# Patient Record
Sex: Female | Born: 1935 | Race: White | Hispanic: No | Marital: Married | Smoking: Never smoker
Health system: Southern US, Community
[De-identification: ages and names within clinical notes are randomized; demographics above are authoritative.]

## PROBLEM LIST (undated history)

## (undated) DIAGNOSIS — K2940 Chronic atrophic gastritis without bleeding: Secondary | ICD-10-CM

## (undated) DIAGNOSIS — L80 Vitiligo: Secondary | ICD-10-CM

## (undated) DIAGNOSIS — M069 Rheumatoid arthritis, unspecified: Secondary | ICD-10-CM

## (undated) DIAGNOSIS — Z862 Personal history of diseases of the blood and blood-forming organs and certain disorders involving the immune mechanism: Secondary | ICD-10-CM

## (undated) DIAGNOSIS — K3183 Achlorhydria: Secondary | ICD-10-CM

## (undated) DIAGNOSIS — S065XAA Traumatic subdural hemorrhage with loss of consciousness status unknown, initial encounter: Secondary | ICD-10-CM

## (undated) DIAGNOSIS — S065X9A Traumatic subdural hemorrhage with loss of consciousness of unspecified duration, initial encounter: Secondary | ICD-10-CM

## (undated) DIAGNOSIS — E079 Disorder of thyroid, unspecified: Secondary | ICD-10-CM

## (undated) HISTORY — DX: Rheumatoid arthritis, unspecified: M06.9

## (undated) HISTORY — DX: Personal history of diseases of the blood and blood-forming organs and certain disorders involving the immune mechanism: Z86.2

## (undated) HISTORY — DX: Traumatic subdural hemorrhage with loss of consciousness status unknown, initial encounter: S06.5XAA

## (undated) HISTORY — PX: APPENDECTOMY: SHX54

## (undated) HISTORY — PX: SPLENECTOMY: SUR1306

## (undated) HISTORY — DX: Chronic atrophic gastritis without bleeding: K29.40

## (undated) HISTORY — DX: Traumatic subdural hemorrhage with loss of consciousness of unspecified duration, initial encounter: S06.5X9A

## (undated) HISTORY — DX: Disorder of thyroid, unspecified: E07.9

## (undated) HISTORY — DX: Vitiligo: L80

## (undated) HISTORY — DX: Achlorhydria: K31.83

## (undated) HISTORY — PX: CATARACT EXTRACTION: SUR2

---

## 2000-11-12 ENCOUNTER — Other Ambulatory Visit: Admission: RE | Admit: 2000-11-12 | Discharge: 2000-11-12 | Payer: Self-pay | Admitting: Gastroenterology

## 2000-11-12 ENCOUNTER — Encounter (INDEPENDENT_AMBULATORY_CARE_PROVIDER_SITE_OTHER): Payer: Self-pay

## 2002-03-16 ENCOUNTER — Inpatient Hospital Stay (HOSPITAL_COMMUNITY): Admission: EM | Admit: 2002-03-16 | Discharge: 2002-03-21 | Payer: Self-pay | Admitting: *Deleted

## 2002-03-17 ENCOUNTER — Encounter (INDEPENDENT_AMBULATORY_CARE_PROVIDER_SITE_OTHER): Payer: Self-pay | Admitting: Cardiology

## 2002-03-19 ENCOUNTER — Encounter: Payer: Self-pay | Admitting: Neurosurgery

## 2002-03-23 ENCOUNTER — Emergency Department (HOSPITAL_COMMUNITY): Admission: EM | Admit: 2002-03-23 | Discharge: 2002-03-23 | Payer: Self-pay | Admitting: Emergency Medicine

## 2002-03-23 ENCOUNTER — Encounter: Payer: Self-pay | Admitting: Emergency Medicine

## 2002-03-31 ENCOUNTER — Encounter: Payer: Self-pay | Admitting: Neurosurgery

## 2002-03-31 ENCOUNTER — Ambulatory Visit (HOSPITAL_COMMUNITY): Admission: RE | Admit: 2002-03-31 | Discharge: 2002-03-31 | Payer: Self-pay | Admitting: Neurosurgery

## 2002-04-10 ENCOUNTER — Ambulatory Visit (HOSPITAL_COMMUNITY): Admission: RE | Admit: 2002-04-10 | Discharge: 2002-04-10 | Payer: Self-pay | Admitting: Neurology

## 2002-04-10 ENCOUNTER — Encounter: Payer: Self-pay | Admitting: Neurology

## 2002-04-18 ENCOUNTER — Ambulatory Visit (HOSPITAL_COMMUNITY): Admission: RE | Admit: 2002-04-18 | Discharge: 2002-04-18 | Payer: Self-pay | Admitting: Neurology

## 2002-04-18 ENCOUNTER — Encounter: Payer: Self-pay | Admitting: Neurology

## 2002-05-11 ENCOUNTER — Encounter: Payer: Self-pay | Admitting: Pediatrics

## 2002-05-11 ENCOUNTER — Inpatient Hospital Stay (HOSPITAL_COMMUNITY): Admission: EM | Admit: 2002-05-11 | Discharge: 2002-05-21 | Payer: Self-pay

## 2002-05-13 ENCOUNTER — Encounter: Payer: Self-pay | Admitting: Neurosurgery

## 2002-05-16 ENCOUNTER — Encounter: Payer: Self-pay | Admitting: Neurosurgery

## 2002-05-17 ENCOUNTER — Encounter: Payer: Self-pay | Admitting: Neurosurgery

## 2002-05-19 ENCOUNTER — Encounter: Payer: Self-pay | Admitting: Neurosurgery

## 2002-05-20 ENCOUNTER — Encounter: Payer: Self-pay | Admitting: Neurosurgery

## 2002-05-21 ENCOUNTER — Encounter: Payer: Self-pay | Admitting: Neurosurgery

## 2002-06-08 ENCOUNTER — Emergency Department (HOSPITAL_COMMUNITY): Admission: EM | Admit: 2002-06-08 | Discharge: 2002-06-09 | Payer: Self-pay | Admitting: Emergency Medicine

## 2002-06-09 ENCOUNTER — Encounter: Payer: Self-pay | Admitting: Emergency Medicine

## 2002-09-25 ENCOUNTER — Encounter: Payer: Self-pay | Admitting: Neurosurgery

## 2002-09-25 ENCOUNTER — Encounter: Admission: RE | Admit: 2002-09-25 | Discharge: 2002-09-25 | Payer: Self-pay | Admitting: Neurosurgery

## 2002-09-27 ENCOUNTER — Ambulatory Visit (HOSPITAL_COMMUNITY): Admission: RE | Admit: 2002-09-27 | Discharge: 2002-09-27 | Payer: Self-pay | Admitting: Neurosurgery

## 2002-09-27 ENCOUNTER — Encounter: Payer: Self-pay | Admitting: Neurosurgery

## 2003-05-29 ENCOUNTER — Other Ambulatory Visit: Admission: RE | Admit: 2003-05-29 | Discharge: 2003-05-29 | Payer: Self-pay | Admitting: Neurosurgery

## 2003-08-18 ENCOUNTER — Encounter: Admission: RE | Admit: 2003-08-18 | Discharge: 2003-08-18 | Payer: Self-pay | Admitting: Neurosurgery

## 2003-08-28 ENCOUNTER — Encounter: Admission: RE | Admit: 2003-08-28 | Discharge: 2003-08-28 | Payer: Self-pay | Admitting: Neurosurgery

## 2003-10-14 ENCOUNTER — Encounter: Admission: RE | Admit: 2003-10-14 | Discharge: 2003-10-14 | Payer: Self-pay | Admitting: Neurosurgery

## 2004-05-10 ENCOUNTER — Ambulatory Visit: Payer: Self-pay | Admitting: Internal Medicine

## 2004-05-25 ENCOUNTER — Ambulatory Visit: Payer: Self-pay | Admitting: Internal Medicine

## 2004-09-22 ENCOUNTER — Ambulatory Visit: Payer: Self-pay | Admitting: Internal Medicine

## 2005-05-12 ENCOUNTER — Ambulatory Visit: Payer: Self-pay | Admitting: Internal Medicine

## 2005-07-13 ENCOUNTER — Ambulatory Visit: Payer: Self-pay | Admitting: Internal Medicine

## 2005-11-07 ENCOUNTER — Ambulatory Visit: Payer: Self-pay | Admitting: Internal Medicine

## 2006-05-10 ENCOUNTER — Ambulatory Visit: Payer: Self-pay | Admitting: Internal Medicine

## 2007-02-01 ENCOUNTER — Telehealth: Payer: Self-pay | Admitting: *Deleted

## 2007-04-30 ENCOUNTER — Telehealth: Payer: Self-pay | Admitting: Internal Medicine

## 2007-05-02 ENCOUNTER — Ambulatory Visit: Payer: Self-pay | Admitting: Internal Medicine

## 2007-08-23 ENCOUNTER — Telehealth: Payer: Self-pay | Admitting: Internal Medicine

## 2007-08-27 ENCOUNTER — Telehealth (INDEPENDENT_AMBULATORY_CARE_PROVIDER_SITE_OTHER): Payer: Self-pay | Admitting: *Deleted

## 2007-09-11 DIAGNOSIS — E039 Hypothyroidism, unspecified: Secondary | ICD-10-CM | POA: Insufficient documentation

## 2007-09-11 DIAGNOSIS — D510 Vitamin B12 deficiency anemia due to intrinsic factor deficiency: Secondary | ICD-10-CM | POA: Insufficient documentation

## 2007-09-11 DIAGNOSIS — Z9089 Acquired absence of other organs: Secondary | ICD-10-CM | POA: Insufficient documentation

## 2007-09-11 DIAGNOSIS — K3183 Achlorhydria: Secondary | ICD-10-CM | POA: Insufficient documentation

## 2007-09-12 ENCOUNTER — Ambulatory Visit: Payer: Self-pay | Admitting: Internal Medicine

## 2007-09-12 DIAGNOSIS — L80 Vitiligo: Secondary | ICD-10-CM | POA: Insufficient documentation

## 2007-09-12 LAB — CONVERTED CEMR LAB
ALT: 22 units/L (ref 0–35)
AST: 36 units/L (ref 0–37)
Albumin: 3.7 g/dL (ref 3.5–5.2)
Basophils Absolute: 0.1 10*3/uL (ref 0.0–0.1)
Calcium: 9.9 mg/dL (ref 8.4–10.5)
Chloride: 100 meq/L (ref 96–112)
Creatinine, Ser: 0.6 mg/dL (ref 0.4–1.2)
Eosinophils Absolute: 0.2 10*3/uL (ref 0.0–0.6)
Eosinophils Relative: 2.3 % (ref 0.0–5.0)
GFR calc non Af Amer: 105 mL/min
Glucose, Bld: 94 mg/dL (ref 70–99)
HCT: 39.2 % (ref 36.0–46.0)
LDL Cholesterol: 104 mg/dL — ABNORMAL HIGH (ref 0–99)
MCV: 94.4 fL (ref 78.0–100.0)
Neutrophils Relative %: 55.8 % (ref 43.0–77.0)
Platelets: 551 10*3/uL — ABNORMAL HIGH (ref 150–400)
RBC: 4.16 M/uL (ref 3.87–5.11)
RDW: 13 % (ref 11.5–14.6)
Sodium: 135 meq/L (ref 135–145)
Total Bilirubin: 0.5 mg/dL (ref 0.3–1.2)
Total CHOL/HDL Ratio: 3
Triglycerides: 77 mg/dL (ref 0–149)
WBC: 9.9 10*3/uL (ref 4.5–10.5)

## 2007-09-17 ENCOUNTER — Telehealth (INDEPENDENT_AMBULATORY_CARE_PROVIDER_SITE_OTHER): Payer: Self-pay | Admitting: *Deleted

## 2008-02-11 ENCOUNTER — Telehealth: Payer: Self-pay | Admitting: Internal Medicine

## 2008-02-18 ENCOUNTER — Ambulatory Visit: Payer: Self-pay | Admitting: Internal Medicine

## 2008-02-18 LAB — CONVERTED CEMR LAB: Vit D, 1,25-Dihydroxy: 88 (ref 30–89)

## 2008-02-19 LAB — CONVERTED CEMR LAB: TSH: 0.09 microintl units/mL — ABNORMAL LOW (ref 0.35–5.50)

## 2008-04-13 ENCOUNTER — Ambulatory Visit: Payer: Self-pay | Admitting: Internal Medicine

## 2008-07-28 ENCOUNTER — Ambulatory Visit: Payer: Self-pay | Admitting: Internal Medicine

## 2008-07-28 DIAGNOSIS — M1710 Unilateral primary osteoarthritis, unspecified knee: Secondary | ICD-10-CM | POA: Insufficient documentation

## 2008-10-05 ENCOUNTER — Ambulatory Visit: Payer: Self-pay | Admitting: Internal Medicine

## 2008-10-05 DIAGNOSIS — M7020 Olecranon bursitis, unspecified elbow: Secondary | ICD-10-CM | POA: Insufficient documentation

## 2008-11-18 ENCOUNTER — Telehealth: Payer: Self-pay | Admitting: Internal Medicine

## 2008-11-23 ENCOUNTER — Telehealth (INDEPENDENT_AMBULATORY_CARE_PROVIDER_SITE_OTHER): Payer: Self-pay | Admitting: *Deleted

## 2008-11-25 ENCOUNTER — Telehealth: Payer: Self-pay | Admitting: Internal Medicine

## 2008-11-26 ENCOUNTER — Ambulatory Visit: Payer: Self-pay | Admitting: Internal Medicine

## 2008-11-26 DIAGNOSIS — K2940 Chronic atrophic gastritis without bleeding: Secondary | ICD-10-CM | POA: Insufficient documentation

## 2008-11-26 DIAGNOSIS — R112 Nausea with vomiting, unspecified: Secondary | ICD-10-CM | POA: Insufficient documentation

## 2008-11-26 DIAGNOSIS — I951 Orthostatic hypotension: Secondary | ICD-10-CM | POA: Insufficient documentation

## 2008-11-26 LAB — CONVERTED CEMR LAB
ALT: 19 units/L (ref 0–35)
AST: 24 units/L (ref 0–37)
Albumin: 3.4 g/dL — ABNORMAL LOW (ref 3.5–5.2)
Alkaline Phosphatase: 155 units/L — ABNORMAL HIGH (ref 39–117)
Basophils Absolute: 0 10*3/uL (ref 0.0–0.1)
Basophils Relative: 0.1 % (ref 0.0–3.0)
Eosinophils Relative: 1.1 % (ref 0.0–5.0)
HCT: 39.8 % (ref 36.0–46.0)
Hemoglobin: 13.3 g/dL (ref 12.0–15.0)
Lymphocytes Relative: 19 % (ref 12.0–46.0)
Lymphs Abs: 2.7 10*3/uL (ref 0.7–4.0)
Monocytes Relative: 1.5 % — ABNORMAL LOW (ref 3.0–12.0)
Neutro Abs: 10.9 10*3/uL — ABNORMAL HIGH (ref 1.4–7.7)
RBC: 4.21 M/uL (ref 3.87–5.11)
Total Protein: 8.1 g/dL (ref 6.0–8.3)

## 2009-01-01 ENCOUNTER — Telehealth: Payer: Self-pay | Admitting: Internal Medicine

## 2009-03-31 ENCOUNTER — Telehealth: Payer: Self-pay | Admitting: Internal Medicine

## 2009-04-09 ENCOUNTER — Ambulatory Visit: Payer: Self-pay | Admitting: Internal Medicine

## 2009-04-09 LAB — CONVERTED CEMR LAB
TSH: 0.25 microintl units/mL — ABNORMAL LOW (ref 0.35–5.50)
Vit D, 25-Hydroxy: 74 ng/mL (ref 30–89)

## 2009-04-20 ENCOUNTER — Telehealth: Payer: Self-pay | Admitting: Internal Medicine

## 2009-04-22 ENCOUNTER — Telehealth: Payer: Self-pay | Admitting: Internal Medicine

## 2009-08-13 IMAGING — CR DG ELBOW COMPLETE 3+V*L*
4 series · 4 of 4 positions shown · non-contrast
Comparison: None

CLINICAL DATA: Osteoarthritis

LEFT ELBOW - COMPLETE 3+ VIEW

[view not recorded (1 of 4)]
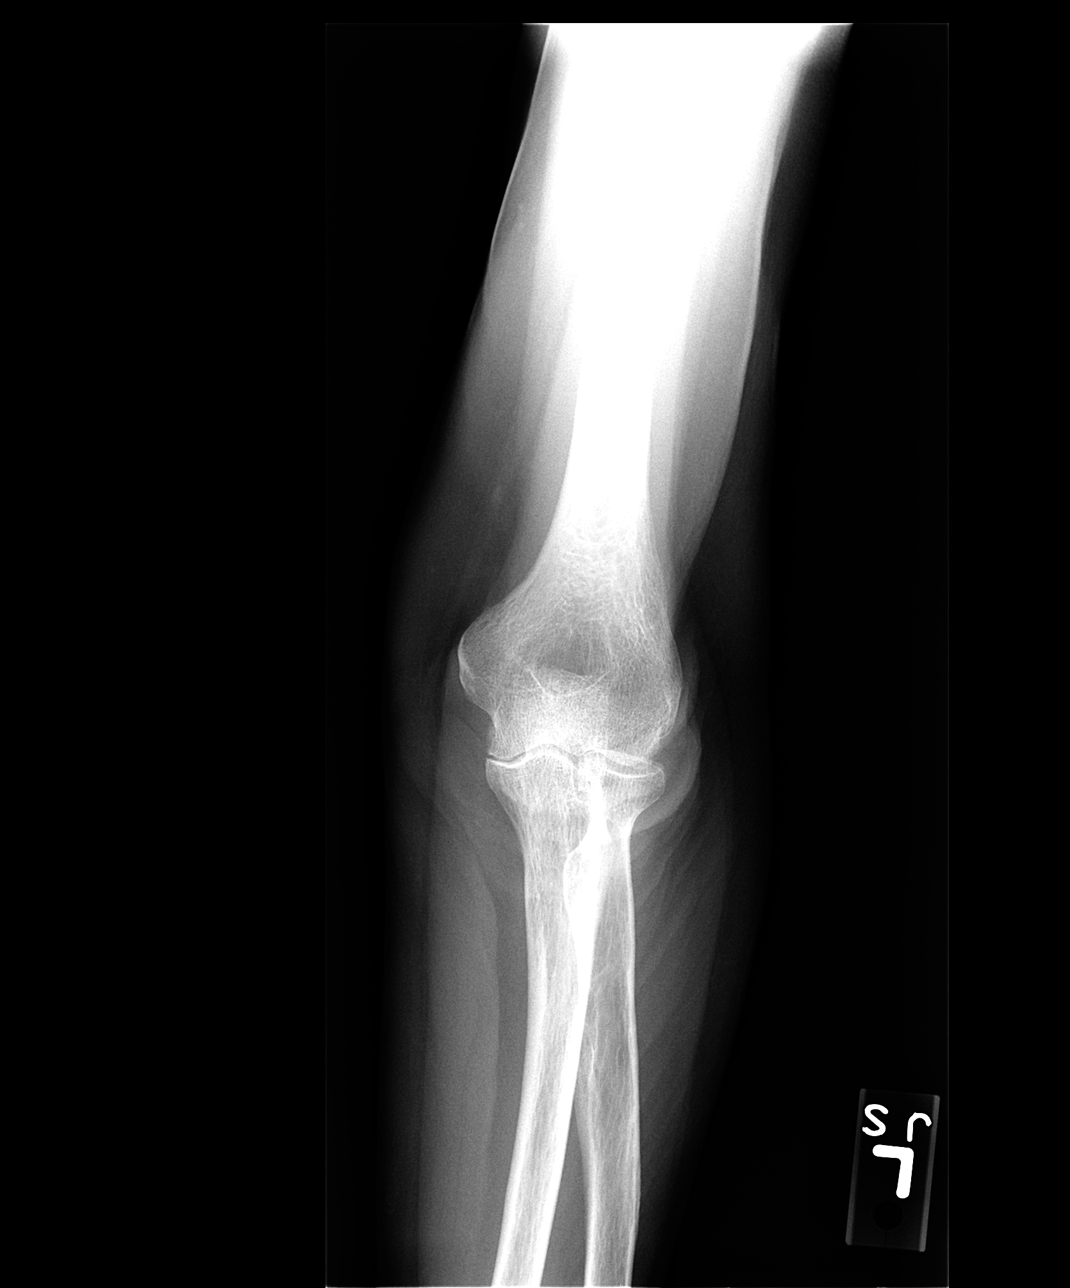

[view not recorded (2 of 4)]
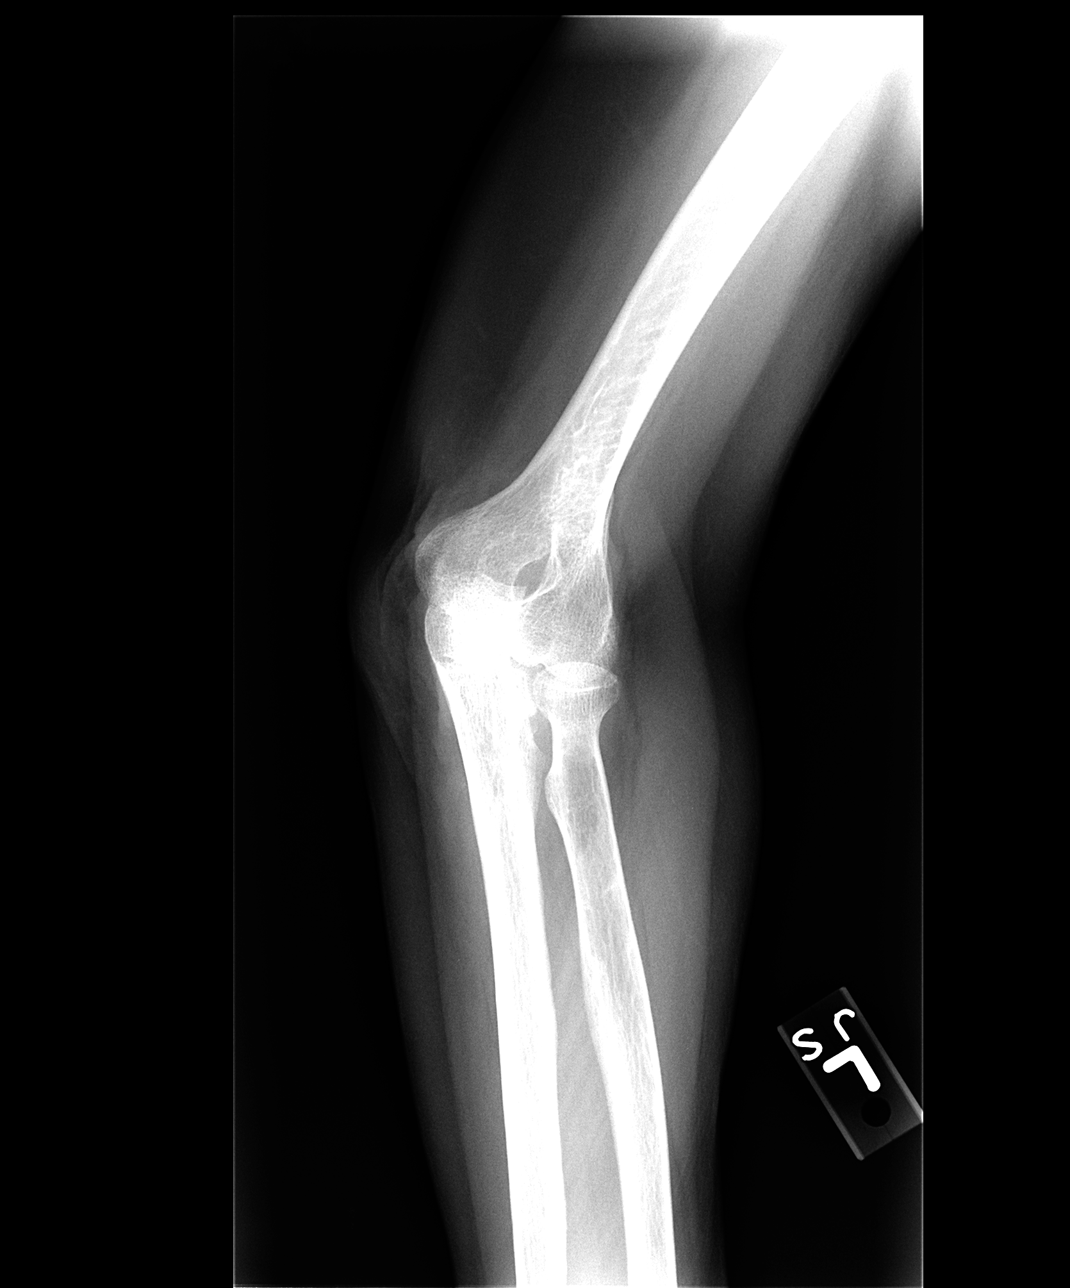

[view not recorded (3 of 4)]
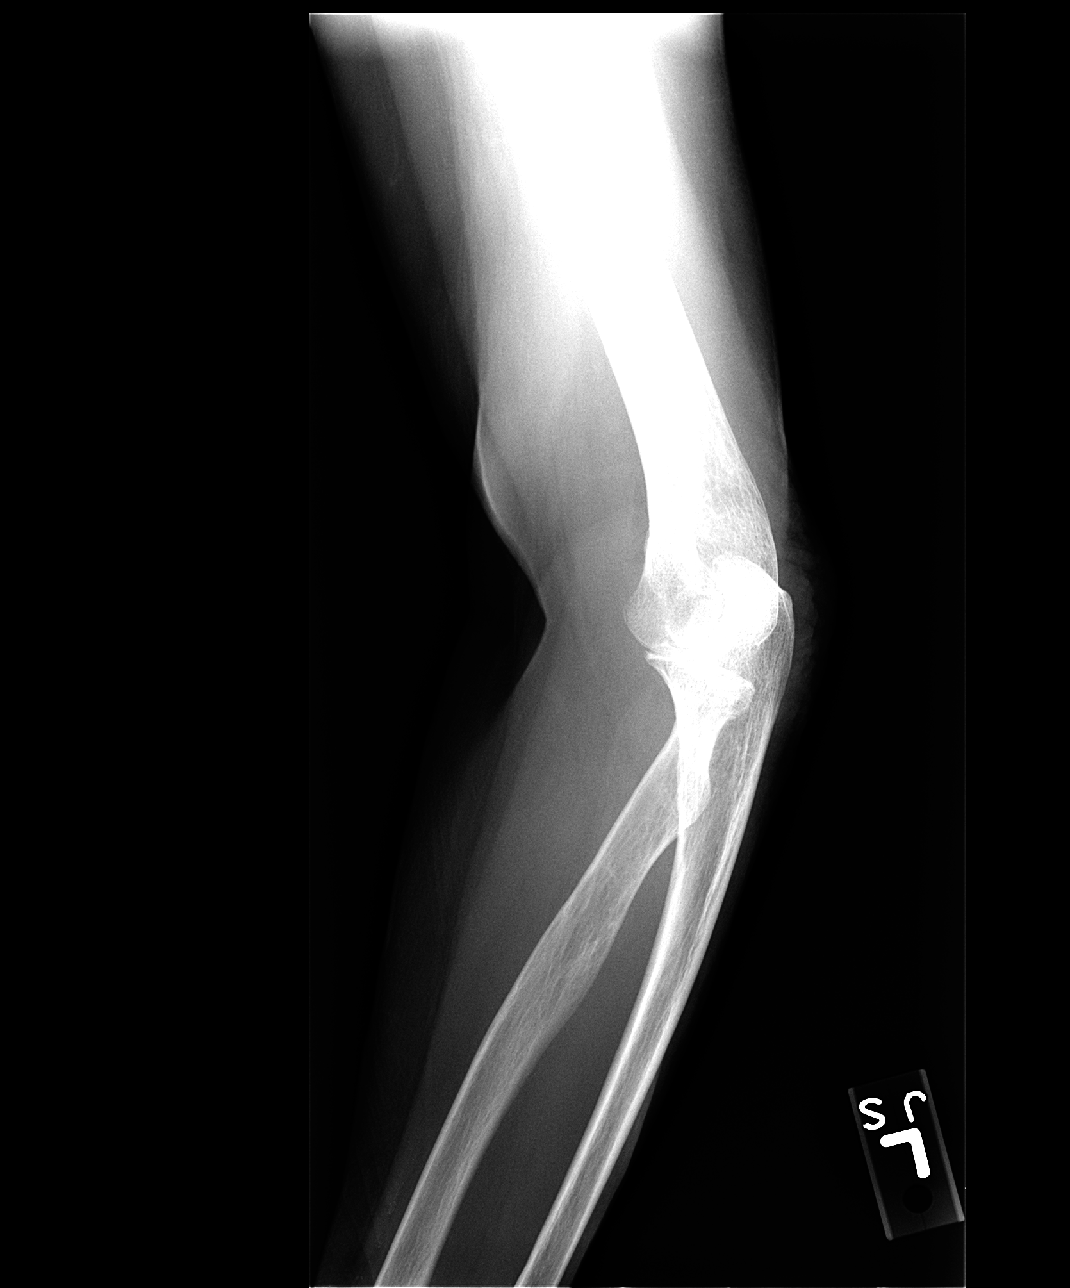

[view not recorded (4 of 4)]
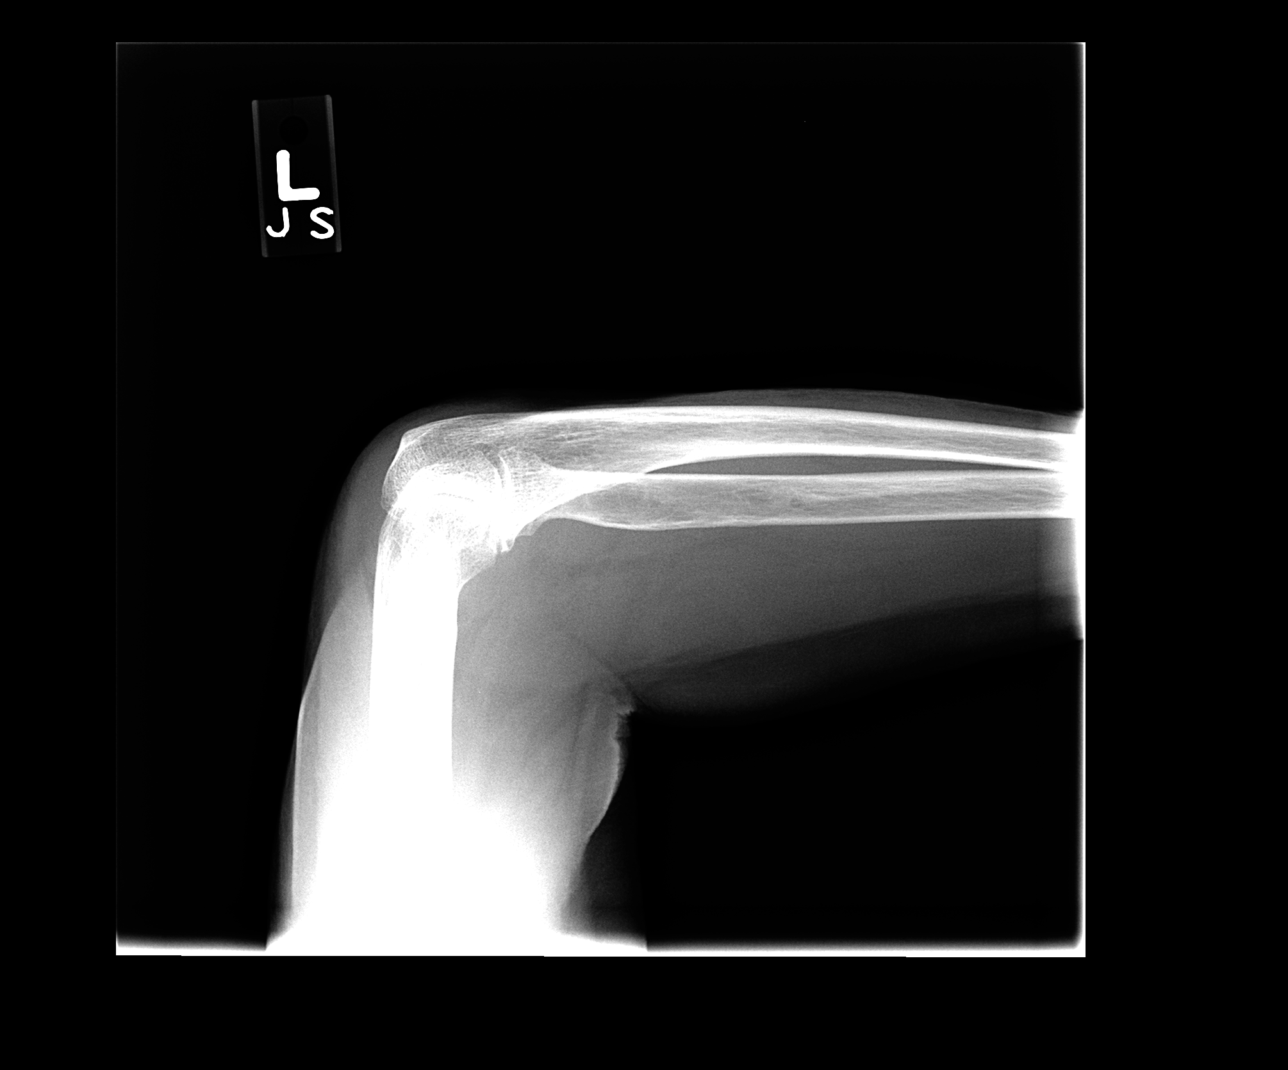

[4 of 4 positions shown; findings below may reference images not displayed]

FINDINGS: There is elevation of the anterior and posterior fat pad
suggesting joint effusion.

There are no specific features to suggest osteoarthritis.

Specifically, the joint spaces are well preserved.  There is no
significant subchondral sclerosis or spur formation.  No discrete
erosions are identified.

No radiopaque foreign bodies or soft tissue calcifications noted.
IMPRESSION: 1.  Suspected joint effusion.  Infectious or inflammatory arthritis
is not excluded.

## 2009-08-30 ENCOUNTER — Ambulatory Visit: Payer: Self-pay | Admitting: Internal Medicine

## 2009-08-30 ENCOUNTER — Telehealth: Payer: Self-pay | Admitting: Internal Medicine

## 2010-02-03 ENCOUNTER — Ambulatory Visit: Payer: Self-pay | Admitting: Internal Medicine

## 2010-02-03 DIAGNOSIS — M1990 Unspecified osteoarthritis, unspecified site: Secondary | ICD-10-CM | POA: Insufficient documentation

## 2010-02-03 LAB — CONVERTED CEMR LAB
BUN: 11 mg/dL (ref 6–23)
Basophils Relative: 0.4 % (ref 0.0–3.0)
CO2: 29 meq/L (ref 19–32)
Chloride: 98 meq/L (ref 96–112)
Eosinophils Absolute: 0.2 10*3/uL (ref 0.0–0.7)
Eosinophils Relative: 1.8 % (ref 0.0–5.0)
Free T4: 1.28 ng/dL (ref 0.60–1.60)
HCT: 36.9 % (ref 36.0–46.0)
Lymphs Abs: 3.2 10*3/uL (ref 0.7–4.0)
MCHC: 33.4 g/dL (ref 30.0–36.0)
MCV: 95.9 fL (ref 78.0–100.0)
Monocytes Absolute: 0.8 10*3/uL (ref 0.1–1.0)
Platelets: 503 10*3/uL — ABNORMAL HIGH (ref 150.0–400.0)
Potassium: 4.1 meq/L (ref 3.5–5.1)
Vitamin B-12: 928 pg/mL — ABNORMAL HIGH (ref 211–911)
WBC: 10.1 10*3/uL (ref 4.5–10.5)

## 2010-04-04 ENCOUNTER — Telehealth: Payer: Self-pay | Admitting: Internal Medicine

## 2010-04-06 ENCOUNTER — Ambulatory Visit: Payer: Self-pay | Admitting: Internal Medicine

## 2010-06-10 ENCOUNTER — Ambulatory Visit: Payer: Self-pay | Admitting: Internal Medicine

## 2010-06-20 ENCOUNTER — Ambulatory Visit: Payer: Self-pay | Admitting: Internal Medicine

## 2010-06-20 ENCOUNTER — Telehealth: Payer: Self-pay | Admitting: Internal Medicine

## 2010-06-20 DIAGNOSIS — L509 Urticaria, unspecified: Secondary | ICD-10-CM | POA: Insufficient documentation

## 2010-07-31 ENCOUNTER — Encounter: Payer: Self-pay | Admitting: Neurosurgery

## 2010-08-09 NOTE — Progress Notes (Signed)
Summary: change pharmacy & ?pneumonia shot  Phone Note Call from Patient Call back at Home Phone [phone]   Reason for Call: Acute Illness Summary of Call: Wants you to change pharmacy from [facility] to [facility] 2) Have appt Wed 3:10 for flu shot.  Also Rushie Goltz gives me yearly pneumonia shot.  How to get it also.   Initial call taken by: Rudy Jew, RN,  April 04, 2010 4:20 PM

## 2010-08-09 NOTE — Assessment & Plan Note (Signed)
Summary: flu shot//ccm  Nurse Visit   Allergies: No Known Drug Allergies  Immunizations Administered:  Pneumonia Vaccine:    Vaccine Type: Pneumovax    Site: right deltoid    Mfr: Merck    Dose: 0.5 ml    Route: IM    Given by: Kern Reap CMA (AAMA)    Exp. Date: 09/22/2011    Lot #: [id number]    VIS given: 06/14/09 version given April 06, 2010.    Physician counseled: yes  Influenza Vaccine # 1:    Vaccine Type: Fluvax MCR    Site: left deltoid    Mfr: GlaxoSmithKline    Dose: 0.5 ml    Route: IM    Given by: Kern Reap CMA (AAMA)    Exp. Date: 01/07/2011    Lot #: [id number]    VIS given: 02/01/10 version given April 06, 2010.    Physician counseled: yes  Orders Added: 1)  Pneumococcal Vaccine [90732] 2)  Admin 1st Vaccine [90471] 3)  Influenza Vaccine MCR [00025]

## 2010-08-09 NOTE — Assessment & Plan Note (Signed)
Summary: boil on neck/njr call was triaged by deb/njr   Vital Signs:  Patient profile:   77 year old female Height:      63 inches Weight:      136 pounds BMI:     24.18 Temp:     98.2 degrees F oral Pulse rate:   76 / minute Resp:     14 per minute BP sitting:   120 / 70  (left arm)  Vitals Entered By: Willy Eddy, LPN (February 03, 2010 11:59 AM) CC: c/o lesion on back of neck Is Patient Diabetic? No   CC:  c/o lesion on back of neck.  History of Present Illness: persistant osteoarthritis the pt has multiple joint involvement she has not present with acute hypotension is stable has been on syntroid but tried to change to the generic with bad resultes needs monitering of levels   Preventive Screening-Counseling & Management  Alcohol-Tobacco     Smoking Status: never     Passive Smoke Exposure: no  Current Problems (verified): 1)  Hypotension, Orthostatic  (ICD-458.0) 2)  Nausea With Vomiting  (ICD-787.01) 3)  Atrophic Gastritis Without Mention of Hemorrhage  (ICD-535.10) 4)  Olecranon Bursitis  (ICD-726.33) 5)  Loc Osteoarthros Not Spec Prim/sec Lower Leg  (ICD-715.36) 6)  Vitiligo  (ICD-709.01) 7)  Physical Examination  (ICD-V70.0) 8)  Anemia, Pernicious  (ICD-281.0) 9)  Achlorhydria  (ICD-536.0) 10)  Splenectomy, Hx of  (ICD-V45.79) 11)  Hypothyroidism  (ICD-244.9)  Current Medications (verified): 1)  Ativan 1 Mg Tabs (Lorazepam) .... One By Mouth Tid 2)  Synthroid 125 Mcg Tabs (Levothyroxine Sodium) .... Once Daily 3)  Cyanocobalamin 1000 Mcg/ml Inj Soln (Cyanocobalamin) .... 1/2 Ml Q Month 4)  Pred Forte 1 %  Susp (Prednisolone Acetate) .... As Directed 5)  Bd Tb Syringe 27g X 1/2" 1 Ml Misc (Tuberculin-Allergy Syringes) .... ,use As Directed  Allergies (verified): No Known Drug Allergies  Past History:  Family History: Last updated: 2007-09-18 father CAD  ( died at 21) mother multiple myeloma ( 89)  Social History: Last updated:  2007-09-18 Married Never Smoked Alcohol use-no Drug use-no Regular exercise-no  Risk Factors: Exercise: no (September 18, 2007)  Risk Factors: Smoking Status: never (02/03/2010) Passive Smoke Exposure: no (02/03/2010)  Past medical, surgical, family and social histories (including risk factors) reviewed, and no changes noted (except as noted below).  Past Medical History: Reviewed history from 09/18/2007 and no changes required. Autoimmune ITP Hypothyroidism achlorhydria chronic autoimmune gastritis vittiligo  Past Surgical History: Reviewed history from 2007/09/18 and no changes required. Appendectomy splenectomy cataract surgery subdural hematoma colon 2002  Family History: Reviewed history from 09-18-2007 and no changes required. father CAD  ( died at 75) mother multiple myeloma ( 72)  Social History: Reviewed history from 09/18/07 and no changes required. Married Never Smoked Alcohol use-no Drug use-no Regular exercise-no  Physical Exam  General:  alert and pale.  dehydrated Head:  normocephalic and atraumatic.   Eyes:  pupils equal and pupils round.   Ears:  R ear normal and L ear normal.   Nose:  External nasal examination shows no deformity or inflammation. Nasal mucosa are pink and moist without lesions or exudates. Mouth:  Oral mucosa and oropharynx without lesions or exudates.  Teeth in good repair. Neck:  No deformities, masses, or tenderness noted. Lungs:  normal respiratory effort and no wheezes.   Heart:  regular rhythm and tachycardia.   Abdomen:  bowel sounds hypoactive, distended, and epigastric tenderness.  Impression & Recommendations:  Problem # 1:  ATROPHIC GASTRITIS WITHOUT MENTION OF HEMORRHAGE (ICD-535.10) monitering by GI dr Jarold Motto Discussed use of medication, as well as lifestyle changes.   Problem # 2:  DEGENERATIVE JOINT DISEASE, ADVANCED (ICD-715.90) pennsaid trial with instruction sheet Discussed use of medications,  application of heat or cold, and exercises.   Problem # 3:  HYPOTHYROIDISM (ICD-244.9)  Her updated medication list for this problem includes:    Synthroid 125 Mcg Tabs (Levothyroxine sodium) ..... Once daily  Labs Reviewed: TSH: 0.23 (08/30/2009)    Chol: 179 (09/12/2007)   HDL: 59.3 (09/12/2007)   LDL: 104 (09/12/2007)   TG: 77 (09/12/2007)  Orders: TLB-TSH (Thyroid Stimulating Hormone) (84443-TSH) TLB-T3, Free (Triiodothyronine) (84481-T3FREE) TLB-T4 (Thyrox), Free [id number])  Problem # 4:  DEGENERATIVE JOINT DISEASE, ADVANCED (ICD-715.90)  trial of cymbalta  Discussed use of medications, application of heat or cold, and exercises.   Complete Medication List: 1)  Ativan 1 Mg Tabs (Lorazepam) .... One by mouth tid 2)  Synthroid 125 Mcg Tabs (Levothyroxine sodium) .... Once daily 3)  Cyanocobalamin 1000 Mcg/ml Inj Soln (Cyanocobalamin) .... 1/2 ml q month 4)  Pred Forte 1 % Susp (Prednisolone acetate) .... As directed 5)  Bd Tb Syringe 27g X 1/2" 1 Ml Misc (Tuberculin-allergy syringes) .... ,use as directed  Other Orders: TLB-BMP (Basic Metabolic Panel-BMET) (80048-METABOL) Venipuncture (32202) TLB-CBC Platelet - w/Differential (85025-CBCD) TLB-B12 + Folate Pnl (54270_62376-E83/TDV)  Patient Instructions: 1)  Please schedule a follow-up appointment in 3 months.

## 2010-08-09 NOTE — Progress Notes (Signed)
Summary: wants lab  Phone Note Call from Patient   Caller: Patient Call For: Stacie Glaze MD Reason for Call: Acute Illness Summary of Call: on synthroid 30 years and recently changed to generic- wants blood work to see if level is right ph [phone] Initial call taken by: Raechel Ache, RN,  August 30, 2009 9:27 AM  Follow-up for Phone Call        please call pot and schedule tsh Follow-up by: Willy Eddy, LPN,  August 30, 2009 11:34 AM  Additional Follow-up for Phone Call Additional follow up Details #1::        pt will come today. Additional Follow-up by: Warnell Forester,  August 30, 2009 12:24 PM

## 2010-08-11 NOTE — Progress Notes (Signed)
  Phone Note Call from Patient   Reason for Call: Privacy/Consent Authorization Summary of Call: pt calling c/o rash on arm  close to injection site-also on arms and elbows and back-using cortisone cream but didnt help--ov with dr Kirtland Bouchard this pm Initial call taken by: Willy Eddy, LPN,  June 20, 2010 10:55 AM  Follow-up for Phone Call        t

## 2010-08-11 NOTE — Assessment & Plan Note (Signed)
Summary: rash/bmw   Vital Signs:  Patient profile:   77 year old female Weight:      135 pounds Temp:     98.1 degrees F oral BP sitting:   110 / 70  (right arm) Cuff size:   regular CC: rash on lt elbow and back  Is Patient Diabetic? No   CC:  rash on lt elbow and back .  History of Present Illness:  77 year old patient who is seen today for follow-up.  Shortly after a local injection of Xylocaine and Depo-Medrol the patient developed a pruritic dermatitis involving the left lateral elbow area.  This has spread to involve urticarial lesions over her back and trunk.  She is quite uncomfortable with the pruritus.  Allergies (verified): No Known Drug Allergies  Physical Exam  General:  Well-developed,well-nourished,in no acute distress; alert,appropriate and cooperative throughout examination Skin:  erythema over the left lateral elbow region with scattered areas of excoriations; scattered  urticaria and urticarial plaques involving the back and trunk areas   Impression & Recommendations:  Problem # 1:  URTICARIA (ICD-708.9) possibly related to the local injection of Xylocaine; the rash did originate at this site  Problem # 2:  ATROPHIC GASTRITIS WITHOUT MENTION OF HEMORRHAGE (ICD-535.10)  Complete Medication List: 1)  Ativan 1 Mg Tabs (Lorazepam) .... One by mouth tid 2)  Synthroid 125 Mcg Tabs (Levothyroxine sodium) .... Once daily 3)  Cyanocobalamin 1000 Mcg/ml Inj Soln (Cyanocobalamin) .... 1/2 ml q month 4)  Pred Forte 1 % Susp (Prednisolone acetate) .... As directed 5)  Bd Tb Syringe 27g X 1/2" 1 Ml Misc (Tuberculin-allergy syringes) .... ,use as directed 6)  Prednisone (pak) 10 Mg Tabs (Prednisone) .... As directed 7)  Triamcinolone Acetonide 0.5 % Crea (Triamcinolone acetonide) .... Apply  twice daily ( avoid face)  Patient Instructions: 1)  prednisone dose pack as prescribed 2)  Please schedule a follow-up appointment as needed. Prescriptions: TRIAMCINOLONE  ACETONIDE 0.5 % CREA (TRIAMCINOLONE ACETONIDE) apply  twice daily ( avoid face)  #60 gm x 0   Entered and Authorized by:   Gordy Savers  MD   Signed by:   Gordy Savers  MD on 06/20/2010   Method used:   Print then Give to Patient   RxID:   [id number] PREDNISONE (PAK) 10 MG TABS (PREDNISONE) as directed  #10 mg 12day x 0   Entered and Authorized by:   Gordy Savers  MD   Signed by:   Gordy Savers  MD on 06/20/2010   Method used:   Print then Give to Patient   RxID:   [id number]    Orders Added: 1)  Est. Patient Level III [84696]

## 2010-08-11 NOTE — Assessment & Plan Note (Signed)
Summary: FU ON LEFT ELBOW/OK PER BONNIE/NJR   Vital Signs:  Patient profile:   77 year old female Height:      63 inches Weight:      134 pounds BMI:     23.82 Temp:     98.2 degrees F oral Pulse rate:   72 / minute Resp:     14 per minute BP sitting:   110 / 70  (left arm)  Vitals Entered By: Willy Eddy, LPN (June 10, 2010 10:33 AM) CC: c/o left elbow pain- requesting injection Is Patient Diabetic? No   Primary Care Provider:  Stacie Glaze MD  CC:  c/o left elbow pain- requesting injection.  History of Present Illness: presenta fro follow up of epicodylitis of left elbow recurrentpain with rotation of elbow tender of epicondyl fro about three weeks  Preventive Screening-Counseling & Management  Alcohol-Tobacco     Smoking Status: never     Passive Smoke Exposure: no  Problems Prior to Update: 1)  Urticaria  (ICD-708.9) 2)  Degenerative Joint Disease, Advanced  (ICD-715.90) 3)  Hypotension, Orthostatic  (ICD-458.0) 4)  Nausea With Vomiting  (ICD-787.01) 5)  Atrophic Gastritis Without Mention of Hemorrhage  (ICD-535.10) 6)  Olecranon Bursitis  (ICD-726.33) 7)  Loc Osteoarthros Not Spec Prim/sec Lower Leg  (ICD-715.36) 8)  Vitiligo  (ICD-709.01) 9)  Physical Examination  (ICD-V70.0) 10)  Anemia, Pernicious  (ICD-281.0) 11)  Achlorhydria  (ICD-536.0) 12)  Splenectomy, Hx of  (ICD-V45.79) 13)  Hypothyroidism  (ICD-244.9)  Current Problems (verified): 1)  Degenerative Joint Disease, Advanced  (ICD-715.90) 2)  Hypotension, Orthostatic  (ICD-458.0) 3)  Nausea With Vomiting  (ICD-787.01) 4)  Atrophic Gastritis Without Mention of Hemorrhage  (ICD-535.10) 5)  Olecranon Bursitis  (ICD-726.33) 6)  Loc Osteoarthros Not Spec Prim/sec Lower Leg  (ICD-715.36) 7)  Vitiligo  (ICD-709.01) 8)  Physical Examination  (ICD-V70.0) 9)  Anemia, Pernicious  (ICD-281.0) 10)  Achlorhydria  (ICD-536.0) 11)  Splenectomy, Hx of  (ICD-V45.79) 12)  Hypothyroidism   (ICD-244.9)  Medications Prior to Update: 1)  Ativan 1 Mg Tabs (Lorazepam) .... One By Mouth Tid 2)  Synthroid 125 Mcg Tabs (Levothyroxine Sodium) .... Once Daily 3)  Cyanocobalamin 1000 Mcg/ml Inj Soln (Cyanocobalamin) .... 1/2 Ml Q Month 4)  Pred Forte 1 %  Susp (Prednisolone Acetate) .... As Directed 5)  Bd Tb Syringe 27g X 1/2" 1 Ml Misc (Tuberculin-Allergy Syringes) .... ,use As Directed  Current Medications (verified): 1)  Ativan 1 Mg Tabs (Lorazepam) .... One By Mouth Tid 2)  Synthroid 125 Mcg Tabs (Levothyroxine Sodium) .... Once Daily 3)  Cyanocobalamin 1000 Mcg/ml Inj Soln (Cyanocobalamin) .... 1/2 Ml Q Month 4)  Pred Forte 1 %  Susp (Prednisolone Acetate) .... As Directed 5)  Bd Tb Syringe 27g X 1/2" 1 Ml Misc (Tuberculin-Allergy Syringes) .... ,use As Directed  Allergies (verified): No Known Drug Allergies  Past History:  Family History: Last updated: 22-Sep-2007 father CAD  ( died at 75) mother multiple myeloma ( 51)  Social History: Last updated: 09-22-07 Married Never Smoked Alcohol use-no Drug use-no Regular exercise-no  Risk Factors: Exercise: no (2007/09/22)  Risk Factors: Smoking Status: never (06/10/2010) Passive Smoke Exposure: no (06/10/2010)  Past medical, surgical, family and social histories (including risk factors) reviewed, and no changes noted (except as noted below).  Past Medical History: Reviewed history from 09/22/07 and no changes required. Autoimmune ITP Hypothyroidism achlorhydria chronic autoimmune gastritis vittiligo  Past Surgical History: Reviewed history from 09-22-07 and no changes  required. Appendectomy splenectomy cataract surgery subdural hematoma colon 2002  Family History: Reviewed history from 09/12/2007 and no changes required. father CAD  ( died at 48) mother multiple myeloma ( 27)  Social History: Reviewed history from 09/12/2007 and no changes required. Married Never Smoked Alcohol  use-no Drug use-no Regular exercise-no  Review of Systems  The patient denies anorexia, fever, weight loss, weight gain, vision loss, decreased hearing, hoarseness, chest pain, syncope, dyspnea on exertion, peripheral edema, prolonged cough, headaches, hemoptysis, abdominal pain, melena, hematochezia, severe indigestion/heartburn, hematuria, incontinence, genital sores, muscle weakness, suspicious skin lesions, transient blindness, difficulty walking, depression, unusual weight change, abnormal bleeding, enlarged lymph nodes, angioedema, and breast masses.    Physical Exam  General:  alert and pale.  dehydrated Head:  normocephalic and atraumatic.   Eyes:  pupils equal and pupils round.   Ears:  R ear normal and L ear normal.   Nose:  External nasal examination shows no deformity or inflammation. Nasal mucosa are pink and moist without lesions or exudates. Mouth:  Oral mucosa and oropharynx without lesions or exudates.  Teeth in good repair. Neck:  No deformities, masses, or tenderness noted. Lungs:  normal respiratory effort and no wheezes.   Heart:  regular rhythm and tachycardia.     Shoulder/Elbow Exam  General:    Well-developed, well-nourished, normal body habitus; no deformities, normal grooming.    Skin:    Intact, no scars, lesions, rashes, cafe au lait spots or bruising.    Inspection:    Inspection is normal.    Elbow Exam:    Left:    Inspection:  Abnormal    Tenderness:  left medial epicondyle    Swelling:  right medial epicondyle   Impression & Recommendations:  Problem # 1:  DEGENERATIVE JOINT DISEASE, ADVANCED (ICD-715.90) Assessment Deteriorated  Orders: No Charge Patient Arrived (NCPA0) (NCPA0)  Problem # 2:  OLECRANON BURSITIS (ICD-726.33) Assessment: Deteriorated  Informed consent obtained and then theleft elbow  joint was prepped in a sterile manor and 40 mg depo and 1/2 cc 1% lidocaine injected into the synovial space. After care discussed. Pt  tolerated procedure well.  Orders: No Charge Patient Arrived (NCPA0) (NCPA0) Joint Aspirate / Injection, Large (20610) Depo- Medrol 40mg  (J1030)  Complete Medication List: 1)  Ativan 1 Mg Tabs (Lorazepam) .... One by mouth tid 2)  Synthroid 125 Mcg Tabs (Levothyroxine sodium) .... Once daily 3)  Cyanocobalamin 1000 Mcg/ml Inj Soln (Cyanocobalamin) .... 1/2 ml q month 4)  Pred Forte 1 % Susp (Prednisolone acetate) .... As directed 5)  Bd Tb Syringe 27g X 1/2" 1 Ml Misc (Tuberculin-allergy syringes) .... ,use as directed 6)  Prednisone (pak) 10 Mg Tabs (Prednisone) .... As directed 7)  Triamcinolone Acetonide 0.5 % Crea (Triamcinolone acetonide) .... Apply  twice daily ( avoid face)  Patient Instructions: 1)  Please schedule a follow-up appointment in 6 months.   Orders Added: 1)  No Charge Patient Arrived (NCPA0) [NCPA0] 2)  Joint Aspirate / Injection, Large [20610] 3)  Depo- Medrol 40mg  [J1030]

## 2010-10-17 ENCOUNTER — Other Ambulatory Visit: Payer: Self-pay | Admitting: *Deleted

## 2010-10-17 MED ORDER — LORAZEPAM 1 MG PO TABS: 1.0000 mg | ORAL_TABLET | Freq: Three times a day (TID) | ORAL | Status: AC

## 2010-11-03 ENCOUNTER — Encounter: Payer: Self-pay | Admitting: Internal Medicine

## 2010-11-11 ENCOUNTER — Ambulatory Visit: Payer: Self-pay | Admitting: Internal Medicine

## 2010-11-14 ENCOUNTER — Other Ambulatory Visit: Payer: Self-pay | Admitting: *Deleted

## 2010-11-14 MED ORDER — LORAZEPAM 1 MG PO TABS: 1.0000 mg | ORAL_TABLET | Freq: Three times a day (TID) | ORAL | Status: DC

## 2010-11-22 ENCOUNTER — Telehealth: Payer: Self-pay | Admitting: *Deleted

## 2010-11-22 NOTE — Telephone Encounter (Signed)
Ok per dr jenkins 

## 2010-11-22 NOTE — Telephone Encounter (Signed)
Pt is asking to be seen today.....Marland Kitchenboth ears are cracking.   No recent URI, only seasonal allergies.  Is not feeling ill.  Does not want to see anyone else here other than Dr. Lovell Sheehan.  Also, does not want any meds, unless Dr. Lovell Sheehan looks like.

## 2010-11-22 NOTE — Telephone Encounter (Signed)
Pt called back, and asked we cancel her requests.  She will see someone  In her home town.

## 2010-11-25 NOTE — Discharge Summary (Signed)
   NAME:  [name] [name], [name] [name]                         ACCOUNT NO.:  [id number]   MEDICAL RECORD NO.:  [id number]                   PATIENT TYPE:  INP   LOCATION:  3022                                 FACILITY:  [facility]   PHYSICIAN:  Donalee Citrin, M.D.                     DATE OF BIRTH:  [birth year]   DATE OF ADMISSION:  05/11/2002  DATE OF DISCHARGE:  05/21/2002                                 DISCHARGE SUMMARY   ADMISSION DIAGNOSIS:  Left subdural hematoma.   DISCHARGE DIAGNOSIS:  Status post evacuation of left subdural hematoma.   HOSPITAL COURSE:  The patient was admitted through the emergency room to the  neurology service and was noted to have increase in size of a left subdural  hematoma. The patient was taken to the operating room and underwent bur hole  procedure.  Postoperative she was taken to the ICU, was maintained flat on  bed rest for two to three days postoperatively.  JP drain placed  intraoperatively with progressively decreasing amount that it was putting  out, however, was maintained as there was still a loculation that persisted.  Follow-up CT scan showed persistent fluid collection.  This drain was noted  to be not adequately draining this loculation so this was removed and a  percutaneous twisting hole drain was placed at the bedside in the ICU and  this was noted to have a large amount of drainage initially and then did not  put out very much over the next couple of days.  But this was subsequently  discontinued and CT scan showed significant resolution of this fluid  collection. The patient was mobilized and seen by therapy and maintained on  prophylactic antibiotics.  Follow-up CT scan showed stable fluid collection.  The patient was subsequently stable to be discharged home on 12th.  At the  time of discharge, the patient was neurologically stable, awake and alert,  and afebrile.  MRI scan obtained at the time of discharge to evaluate the  source of the subdural as the  patient had a spontaneous hemorrhage with only  past medical history of ITT, did not show any underlying vascular  malformation or tumor. The patient was table enough to be discharged home  with follow-up.                                               Donalee Citrin, M.D.    GC/MEDQ  D:  09/11/2002  T:  09/11/2002  Job:  [id number]

## 2010-11-25 NOTE — Op Note (Signed)
   NAME:  [name] [name], [name] [name]                         ACCOUNT NO.:  [id number]   MEDICAL RECORD NO.:  [id number]                   PATIENT TYPE:  INP   LOCATION:  3022                                 FACILITY:  [facility]   PHYSICIAN:  Donalee Citrin, M.D.                     DATE OF BIRTH:  02/28/1934   DATE OF PROCEDURE:  05/13/2002  DATE OF DISCHARGE:  05/21/2002                                 OPERATIVE REPORT   PREOPERATIVE DIAGNOSIS:  Left acute subdural hematoma.   SURGEON:  Donalee Citrin, M.D.   ANESTHESIA:  General endotracheal.   CLINICAL NOTE:  The patient is a very pleasant 77 year old female who was  admitted through the neurology service and the ER with persistent right hand  and arm seizures.  CT scan showed interval increase in her left-sided  subdural hematoma that had initially been diagnosed approximately a month  ago.  The CT scan showed enlargement, and this measured now approximately 1-  1.5 cm larger.  The patient, once stabilized overnight and observed, the  patient was taken to the operating room on 05/13/02.   DESCRIPTION OF PROCEDURE:  The patient was brought to the OR, was induced  under general anesthesia, positioned supine, and the head turned to the  right side, exposing the left cranium.  Two bur holes were drilled, left  frontal and left parietal, with a high-speed drill, and the dura was then  incised in a cruciate fashion.  A large amount of old, motor oil-appearing  blood was easily irrigated out.  A red rubber catheter was also used to  ensure that all loculations were also removed.  With removal, all irrigant  was noted to be clear.  There was no further blood appreciated.  The cortex  was clearly visible through the bur hole defects.  A drain was placed and  the patient was sent to the ICU.  The patient was awoken and sent to the  recovery room in stable condition.                                               Donalee Citrin, M.D.    GC/MEDQ  D:  09/11/2002  T:   09/11/2002  Job:  [id number]

## 2010-11-25 NOTE — Discharge Summary (Signed)
NAME:  [name] [name], [name] [name]                         ACCOUNT NO.:  [id number]   MEDICAL RECORD NO.:  [id number]                   PATIENT TYPE:  INP   LOCATION:  3002                                 FACILITY:  [facility]   PHYSICIAN:  Cristi Loron, M.D.            DATE OF BIRTH:  10/23/1933   DATE OF ADMISSION:  03/16/2002  DATE OF DISCHARGE:  03/21/2002                                 DISCHARGE SUMMARY   For full details of this patient, please refer to Admission History and  Physical.   BRIEF HISTORY:  The patient is a 77 year old white female who presented with  an episode of slurred speech.  She was worked up in the emergency  department, where a CAT scan demonstrated a bilateral subdural hematoma.  As  a neurosurgical event, the patient was subsequently admitted by me for  observation.   HOSPITAL COURSE:  The patient was admitted on March 16, 2002.  Her CAT  scan was repeated on several occasions, but during this hospitalization  remained stable.  Hospital course was remarkable only for a couple of  episodes of numbness.  She was seen by Dr. Kelli Hope and felt to be  having seizures.  She was started on Keppra, and she had no more episodes.   We did workup with carotid Dopplers and echocardiogram, as well as a Holter  monitor.  A stroke workup was done.  These were all negative.   By March 21, 2002, the patient was afebrile and stable.  She was eating  well, ambulating well.  She had no more episodes of numbness within the last  36 hours, and she wanted to be discharged home.  She was therefore  discharged home on March 21, 2002.   DISCHARGE INSTRUCTIONS:  The patient was instructed not to drive and to  follow up with me in 1 week for a C-scan, to follow up with Dr. Thad Ranger,  and the patient was instructed to see [place] or call for any additional seizures  or if she develops any confusion, weakness, etc.   DISCHARGE MEDICATIONS:  The patient was given  prescriptions for Lortab 10  #60 one p.o. q.4h. p.r.n. for pain, Keppra 250 mg #60 one p.o. b.i.d.,  Ativan 1 mg #50 two p.o. daily p.r.n.    FINAL DIAGNOSES:  1. Bilateral subdural hematoma.  2. Seizures.   She was also instructed to resume her outpatient medical regimen.                                               Cristi Loron, M.D.    JDJ/MEDQ  D:  03/21/2002  T:  03/23/2002  Job:  [id number]   cc:   Casimiro Needle L. Thad Ranger, M.D.  [address]  [place]  [place] [address]  Fax: [phone]

## 2010-11-25 NOTE — Consult Note (Signed)
NAME:  [name] [name], [name] [name]                         ACCOUNT NO.:  [id number]   MEDICAL RECORD NO.:  [id number]                   PATIENT TYPE:  INP   LOCATION:  3002                                 FACILITY:  [facility]   PHYSICIAN:  Casimiro Needle L. Thad Ranger, M.D.           DATE OF BIRTH:  02-10-1934   DATE OF CONSULTATION:  03/19/2002  DATE OF DISCHARGE:                                   CONSULTATION   REASON FOR EVALUATION:  Slurred speech and transient left leg weakness.   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  Admitted to the hospital on the neurosurgical  service on March 16, 2002 after she presented to the emergency room with  a couple of unusual spells.  She reports that a couple of days prior to  admission she had been talking with her daughter when suddenly, for about 30  seconds, she could not make the words come out right.  She knew what she  wanted to say but had a hard time saying things that were understandable.  This completely resolved and was not associated with any sort of motor or  sensory dysfunction and no alteration of consciousness.  Then, the day of  admission she had been walking in the mall parking lot with her daughter  when she suddenly felt like her left leg was not working very well.  She  actually sat down in the middle of the parking lot.  This again lasted about  30 seconds then completely resolved.  There was not any alteration of  consciousness and nothing involving the face, arm, or speech at that time.  She stayed in the emergency room and underwent CT of the head, which  demonstrated bilateral subdural fluid collections, felt to be hematomas,  with a small acute component in the  right subdural.  She was subsequently  admitted for observation.  She had had no further spells, until today, at  which time she reported some spells involving the usual symptoms in the left  lower extremity.  Because of possible TIA's, she has had a stroke workup and  we were asked for a further opinion.   PAST MEDICAL HISTORY:  As above.  She has had some arthritis and history of  __________  and cataract removals.   SOCIAL HISTORY:  The patient is married, is a retired Engineer, civil (consulting) at [employer].  She does not use tobacco, alcohol, or illicit drugs.   MEDICATIONS:  1. Synthroid.  2. Ambien.  3. Ativan p.r.n.   REVIEW OF SYSTEMS:  She complains of a significant headache, which is worse  when she lies down.  She denies any persistent focal neurologic deficits.  She has no  deficits involving vision or gait.  She has had no chest pain,  shortness of breath, nausea, or vomiting.   PHYSICAL EXAMINATION:  VITAL SIGNS:  Temperature 98.1, blood pressure  104/56, pulse 95, respirations 20.  GENERAL/MENTAL STATUS:  She is awake, alert, and in no evident distress.  HEENT:  Head:  Cranium is normocephalic and atraumatic.  Oropharynx is  benign.  NECK:  Supple without carotid bruits.  HEART:  Regular rate and rhythm without murmurs.  NEUROLOGIC:  Pupils are equal and briskly reactive.  Extraocular movements  normal without nystagmus.  Visual fields full to confrontation.  Face,  tongue, and palate move normally and symmetrically.  Motor:  Normal bulk and  tone.  There is very minimal weakness of the left lower extremity.  Otherwise, normal strength throughout.  Sensation is intact to light touch  and pinprick in all extremities.  Reflexes are 2+ and symmetric.  Toes are  downgoing.  Finger-to-nose is performed well.  Rapid alternating movements  are normal.  Gait is normal.  She is able to heel, toe, and tandem walk.   LABORATORY DATA:  A carotid Doppler reveals no significant lesions.   Echocardiogram reveals no cardiac source of emboli.   Labs per daily notes.   IMPRESSION:  1. Transient spells of dysarthria and left lower extremity motor     dysfunction.  Most likely, given the findings on her CT, these are simple     partial seizures.  It is quite unlikely that these are transient ischemic     attacks.  2. Bilateral subdural hematomas of unclear etiology.  3. 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   PLAN:  The likely nature of these spells is discussed with the patient,  along with the fact that she __________  therapy for additional workup of  __________  therapy.  If possible, we will use anticonvulsants.  The patient  stated that since her symptoms were brief and mild, she would like to try no  __________  and that is reasonable.  If, however, her symptoms continue  and/or worsen, would suggest a low-dose anticonvulsant with no bone marrow  effects.  Good candidates would be Keppra 500 mg b.i.d. or Neurontin 300-600  mg t.i.d.  We will check an EEG as well.   Thank you for the consult.                                               Michael L. Thad Ranger, M.D.    MLR/MEDQ  D:  03/20/2002  T:  03/21/2002  Job:  [id number]

## 2010-11-25 NOTE — Consult Note (Signed)
NAME:  [name] [name], [name] [name]                         ACCOUNT NO.:  [id number]   MEDICAL RECORD NO.:  [id number]                   PATIENT TYPE:  INP   LOCATION:  3002                                 FACILITY:  [facility]   PHYSICIAN:  Cristi Loron, M.D.            DATE OF BIRTH:  1933/10/13   DATE OF CONSULTATION:  03/16/2002  DATE OF DISCHARGE:                                   CONSULTATION   CHIEF COMPLAINT:  Slurred speech.   HISTORY OF PRESENT ILLNESS:  The patient is a 77 year old, white female who  was in her usual state of good health until 03/14/2002.  At that time, she  noticed some trouble with tying her shoe.  Things were fine throughout the  week until this afternoon, when she had an episode in which she had some  slurred speech.  The patient and her daughter noticed while she was in the  mall, either to be dysarthria of dysphasia or a motor dysphasia.  It only  lasted a minute or so.  She subsequently came to the emergency department  where she was evaluated by Dr. Wallace Cullens including a cranial CT scan, which  demonstrated bilateral subdural hematomas and a neurosurgical consultation  was requested.   Presently the patient says she feels fairly well.  She has had no more of  these episodes of slurred speech.  She has had a chronic headache over the  last week or so.  She denies any trauma whatsoever.  She has had no noted  seizures, or nausea, vomiting, weakness, numbness, tingling, chest pain,  shortness of breath, etc.   PAST MEDICAL HISTORY:  Positive for immune thrombotic purpura.  She had an  autoimmune fibroiditis, vitiligo, __________, arthritis, remote history of  an appendicitis and cataracts and hyperthyroidism.   PAST SURGICAL HISTORY:  Splenectomy, appendectomy, cataract removal.   MEDICATIONS PRIOR TO ADMISSION:  Synthroid 0.125 mg p.o. q.d., Ativan 1 mg  p.o. t.i.d. p.r.n., Ambien 10 mg p.o. h.s. p.r.n.   DRUG ALLERGIES:  PENICILLIN AND SULFA.   FAMILY  MEDICAL HISTORY:  Noncontributory.   SOCIAL HISTORY:  The patient is married.  She has two children.  She lives  in [place].  She is a retired Engineer, civil (consulting) from [employer].  She denies  tobacco, ethanol or drug use.   REVIEW OF SYSTEMS:  Negative except as above.   PHYSICAL EXAMINATION:  GENERAL:  A pleasant, healthy-appearing, 77 year old,  white female in no apparent distress.  HEENT :  Normocephalic, atraumatic.  Pupils equal, round, and reactive to  light.  Extraocular muscles are intact.  Oropharynx benign.  Uvula midline.  NECK:  Supple without deformities, masses,  tracheal division or carotid  bruits.  She has normal range of motion.  THORAX:  Symmetric.  LUNGS:  Clear to auscultation.  HEART:  Regular rate and rhythm.  ABDOMEN:  Soft, nontender.  EXTREMITIES:  Without deformities.  NEUROLOGICAL:  The patient is alert and oriented times 3.  Cranial nerves II  through XII are grossly intact bilaterally.  Vision and hearing are grossly  normal bilaterally.  Motor strength is 5/5 in her bilateral deltoid, biceps,  triceps, and hand grip, psoas, gastrocnemius, extensor longus.  She does  have some slight drift __________ on the left.  Cerebellar exam is intact of  her rapid alternating movements of the upper extremities.  Bilateral sensory  exam is intact to light touch in all dermatomes bilaterally.  Deep tendon  reflexes are 1-2/4 in her bilateral biceps, triceps, quadriceps,  gastrocnemius.  She has bilateral flexor plantar reflexes.  No active  clonus.   IMAGING STUDIES:  I reviewed the patient's head CT performed without  contrast in [facility] today.  It demonstrates a small right rare  left subdural hematoma without significant mass effect, midline shift, etc.  There is some acute blood on the right side.   ASSESSMENT/PLAN:  Bilateral subdural hematomas.  I have discussed the  situation with the patient, her husband and daughter.  I think this is  likely  the cause of her symptoms, possibly either from direct mass effect or  having a seizure.  I told her the subdural hematomas as small and does not  need surgery right now, just observation with serial scans.  If it gets  bigger she may need surgery.  She has had no more of these episodes and I do  not think she needs to be on anticonvulsants at this point.  I will admit  her for observation.   I did tell her that it is possible that her symptoms were from a transient  ischemic attack, but unlikely.  I think it is prudent to work her up with a  carotid ultrasound, electrocardiogram, and put her on a cardiac monitor.   The patient and her family are agreeable to this plan.                                                 Cristi Loron, M.D.    JDJ/MEDQ  D:  03/16/2002  T:  03/18/2002  Job:  [id number]   cc:   Pearletha Alfred, M.D.   Areatha Keas, M.D.  [address]  [address]  [place]  [place] [address]  Fax: [phone]   Jill Side

## 2010-11-25 NOTE — Consult Note (Signed)
NAME:  [name] [name], [name] [name]                         ACCOUNT NO.:  [id number]   MEDICAL RECORD NO.:  [id number]                   PATIENT TYPE:  INP   LOCATION:  5501                                 FACILITY:  [facility]   PHYSICIAN:  Deanna Artis. Sharene Skeans, M.D.           DATE OF BIRTH:  09-10-1933   DATE OF CONSULTATION:  DATE OF DISCHARGE:                                   CONSULTATION   CHIEF COMPLAINT:  Numbness in the right hand.   HISTORY OF PRESENT ILLNESS:  The patient  had onset of her symptoms 9/7 and  was admitted for five days to [facility].  She has somatosensory  seizures and speech disturbance.  This somatosensory seizures involved the  left side her body.  She had evidence of bilateral subdurals left greater  than right.  She was seen by Delma Officer from [facility] and  also Dr. Jacki Cones.  This is managed conservatively.  The patient was  not able to be seen further by [facility] because of the  patient's insurance.  She has partners and [facility] does not  accept that.  Nonetheless, the patient did well.  She was treated initially  with Dilantin and developed hives.  She was switched to Neurontin and has  tolerated the medicine well with no seizures.   Tonight, she had onset of numbness initially in her second through fourth  digits, then in her thumb through four digits and the right ear.  This was  intermittent and occurred for a few seconds to a few minutes at a time one  three occasions since 6 p.m.  She has also had a three-day history of a dull  left-sided headache.  We recommended that she come to the [facility] for  cranial CT which showed marked increase in her left subdural extending from  the frontal to parietal region over the convexity.  This was discussed with  Dr. Maeola Harman who agreed for the need for admission and evacuation of the  left subdural.  I later learned that [facility] cannot see this  patient.   PAST MEDICAL HISTORY:  1. Autoimmune ITP with bruising.  Treated with steroids for a long time     until she had a splenectomy which cured the problem.  2. Hyperthyroidism (Grave's disease treated with radioactive iodine).  3. Achlorhydria with pernicious anemia.  4. Vitiligo.   PAST SURGICAL HISTORY:  1. Splenectomy 25 years ago.  2. Appendectomy at the same time.  3. Right iridectomy in 2002.   MEDICATIONS:  1. Neurontin 300 mg three times a day.  2. Synthroid 0.125 mg per day.  3. Lorazepam 1 mg one to two, up to three times per day.  She does not use     more than about three per day total.   The patient also appears to have gotten the influenza vaccine this year, but  has yet  gotten the pneumococcal vaccine.   ALLERGIES:  1. DILANTIN.  2. PENICILLIN.  3. SULFA.  4. DILAUDID.   REVIEW OF SYSTEMS:  The patient  has not had fever or infections.  No  bleeding problems.  No injuries.  Mild bruisability.  No anemia.  No  diabetes.  She has arthritis in her back, hands and feet.  She is  postmenopausal.  She wears contacts.  12-system review is otherwise  negative.   FAMILY HISTORY:  Mother died of multiple myeloma at 64 years of age.  Father  died of diabetes and atherosclerotic cardiovascular disease at 32 years of  age.  Brother age 73 is in good health.  She has two children in good  health.   SOCIAL HISTORY:  She has been married for 46 years.  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  She does not smoke or drink alcohol.   PHYSICAL EXAMINATION:  VITAL SIGNS:  Blood pressure 120/68, resting pulse  112, respirations 20, temperature 97.4. Pulse oximetry 95%.  EAR, NOSE AND THROAT:  No signs of infection.  No bruits.  LUNGS:  Clear.  HEART:  No murmurs. Pulse is normal.  ABDOMEN:  Soft.  Bowel sounds normal. No hepatosplenomegaly.  EXTREMITIES:  Normal.  NEUROLOGIC:  Mental status:  The patient was awake, alert.  No dysphasia or  dyspraxia.  Cranial  nerves:  Round, reactive pupils.  Fundi normal.  Visual  fields full to double simultaneous stimuli.  Symmetric facial strength.  Midline tongue and uvula.  Air conduction greater than bone conduction.  Motor examination:  Normal strength, tone and mass.  Fine motor  movements  present without drift.  Sensory examination intact to primary and cortical  modalities.  Cerebellar examination:  Good finger-to-nose.  Gait was normal  except for slightly broad based.  She had slight difficulty with tandem.  Deep tendon reflexes are diminished.  The patient  had bilateral flexor  plantar responses.   IMPRESSION:  1. Subdural hematoma left side (on CT scan with local mass affect).  2. Idiopathic thrombocytopenic purpura.  3. Achlorhydria with pernicious anemia.  4. History of Grave's disease.   PLAN:  Admit and consult with Dr. Donalee Citrin to evaluate the patient and  evacuate the __________ subdural if he agrees.  Blood tests:  We ordered CBC  with diff and platelets, PT, PTT, comprehensive metabolic panel, and type  and hold.                                               Deanna Artis. Sharene Skeans, M.D.    [facility]  D:  05/11/2002  T:  05/12/2002  Job:  [id number]   cc:   Stacie Glaze, M.D. [facility]   Donalee Citrin, M.D.  [address]  [place]  [place] [address]  Fax: [phone]

## 2010-12-02 ENCOUNTER — Encounter: Payer: Self-pay | Admitting: Family Medicine

## 2010-12-02 ENCOUNTER — Ambulatory Visit (INDEPENDENT_AMBULATORY_CARE_PROVIDER_SITE_OTHER): Payer: Medicare Other | Admitting: Family Medicine

## 2010-12-02 VITALS — BP 118/72 | HR 109 | Temp 97.8°F | Resp 14 | Wt 132.0 lb

## 2010-12-02 DIAGNOSIS — H6980 Other specified disorders of Eustachian tube, unspecified ear: Secondary | ICD-10-CM

## 2010-12-02 DIAGNOSIS — H6990 Unspecified Eustachian tube disorder, unspecified ear: Secondary | ICD-10-CM

## 2010-12-02 MED ORDER — METHYLPREDNISOLONE ACETATE 80 MG/ML IJ SUSP
60.0000 mg | Freq: Once | INTRAMUSCULAR | Status: AC
  Administered 2010-12-02: 60 mg via INTRAMUSCULAR

## 2010-12-02 NOTE — Progress Notes (Signed)
  Subjective:    Patient ID: [name] [name], female    DOB: [birth year], 77 y.o.   MRN: [id number]  HPI Here for 2 weeks of pressure in both ears, decreased hearing, slight dizziness, and discomfort. At first she had actual pain in the ears, but this has improved. No HA or ST or cough or fever. She saw Urgent Care a few days ago, and they told her she had "fluid" in her ears. They told her to take Claritin and Flonase. Now she is only mildly better.    Review of Systems  Constitutional: Negative.   HENT: Positive for hearing loss and ear pain. Negative for congestion, sore throat, postnasal drip, sinus pressure, tinnitus and ear discharge.   Eyes: Negative.   Respiratory: Negative.   Neurological: Positive for dizziness. Negative for headaches.       Objective:   Physical Exam  Constitutional: She is oriented to person, place, and time. She appears well-developed and well-nourished.  HENT:  Head: Normocephalic and atraumatic.  Right Ear: External ear normal.  Left Ear: External ear normal.  Nose: Nose normal.  Mouth/Throat: Oropharynx is clear and moist. No oropharyngeal exudate.  Eyes: Conjunctivae and EOM are normal. Pupils are equal, round, and reactive to light.  Neck: Normal range of motion. Neck supple. No thyromegaly present.  Pulmonary/Chest: Effort normal and breath sounds normal.  Lymphadenopathy:    She has no cervical adenopathy.  Neurological: She is alert and oriented to person, place, and time. No cranial nerve deficit.          Assessment & Plan:  Stop Claritin and add Mucinex bid. Continue on Flonase. Given a depomedrol shot.

## 2010-12-02 NOTE — Progress Notes (Signed)
Addended by: Regis Bill on: 12/02/2010 02:32 PM   Modules accepted: Orders

## 2010-12-09 ENCOUNTER — Ambulatory Visit (INDEPENDENT_AMBULATORY_CARE_PROVIDER_SITE_OTHER): Payer: Medicare Other | Admitting: Internal Medicine

## 2010-12-09 ENCOUNTER — Encounter: Payer: Self-pay | Admitting: Internal Medicine

## 2010-12-09 DIAGNOSIS — E039 Hypothyroidism, unspecified: Secondary | ICD-10-CM

## 2010-12-09 DIAGNOSIS — K3183 Achlorhydria: Secondary | ICD-10-CM

## 2010-12-09 DIAGNOSIS — M069 Rheumatoid arthritis, unspecified: Secondary | ICD-10-CM

## 2010-12-09 DIAGNOSIS — M7020 Olecranon bursitis, unspecified elbow: Secondary | ICD-10-CM

## 2010-12-09 NOTE — Progress Notes (Signed)
Subjective:    Patient ID: [name] [name], female    DOB: 1934/06/27, 77 y.o.   MRN: [id number]  HPI  New diagnosis of RA made in April She is on MXT shots She had ear pain and was seen by the urgent care and diagnosis was fluid in ear She say [place] and the diagnosis was eustation tube dysfuction She was treated with Depo-Medrol injections at the site of her injection she has developed a severe rash suggesting that she has developed an allergic reaction to the Depo in  Depo-Medrol.  Apparently her rheumatologist has done titers I suspect c peptide levels were high and he elected to put her on methotrexate.    Review of Systems  Constitutional: Negative for activity change, appetite change and fatigue.  HENT: Negative for ear pain, congestion, neck pain, postnasal drip and sinus pressure.   Eyes: Negative for redness and visual disturbance.  Respiratory: Negative for cough, shortness of breath and wheezing.   Gastrointestinal: Negative for abdominal pain and abdominal distention.  Genitourinary: Negative for dysuria, frequency and menstrual problem.  Musculoskeletal: Positive for myalgias, joint swelling and arthralgias.  Skin: Negative for rash and wound.  Neurological: Negative for dizziness, weakness and headaches.  Hematological: Negative for adenopathy. Does not bruise/bleed easily.  Psychiatric/Behavioral: Negative for sleep disturbance and decreased concentration.   Past Medical History  Diagnosis Date  . History of ITP     autoimmune  . Thyroid disease   . Achlorhydria   . Autoimmune gastritis   . Vitiligo   . Subdural hematoma   . Rheumatoid arthritis    Past Surgical History  Procedure Date  . Appendectomy   . Splenectomy   . Cataract extraction     reports that she has never smoked. She does not have any smokeless tobacco history on file. She reports that she does not drink alcohol or use illicit drugs. family history includes Cancer in her mother; Heart  disease in her sister; Multiple myeloma in her mother; and Parkinsonism in her maternal aunt. Allergies  Allergen Reactions  . Depo-Medrol (Methylprednisolone Acetate)     Patient has apparent allergy to the drug ingredient in Depo-Medrol that allows time to release he has a rash at the site of injections  . Dilantin   . Keppra   . Penicillins   . Sulfa Antibiotics        Objective:   Physical Exam  Constitutional: She is oriented to person, place, and time. She appears well-developed and well-nourished. No distress.  HENT:  Head: Normocephalic and atraumatic.  Right Ear: External ear normal.  Left Ear: External ear normal.  Nose: Nose normal.  Mouth/Throat: Oropharynx is clear and moist.  Eyes: Conjunctivae and EOM are normal. Pupils are equal, round, and reactive to light.  Neck: Normal range of motion. Neck supple. No JVD present. No tracheal deviation present. No thyromegaly present.  Cardiovascular: Normal rate, regular rhythm, normal heart sounds and intact distal pulses.   No murmur heard. Pulmonary/Chest: Effort normal and breath sounds normal. She has no wheezes. She exhibits no tenderness.  Abdominal: Soft. Bowel sounds are normal.  Musculoskeletal: She exhibits edema and tenderness.  Lymphadenopathy:    She has no cervical adenopathy.  Neurological: She is alert and oriented to person, place, and time. She has normal reflexes. No cranial nerve deficit.  Skin: Skin is warm and dry. She is not diaphoretic.  Psychiatric: She has a normal mood and affect. Her behavior is normal.  Assessment & Plan:  Patient is new diagnosis of rheumatoid arthritis was placed on methotrexate she presented with synovitis her elbows I suspect this is part of a autoimmune disease that includes her vitiligo  achlorhydria and onset of synovitis.  This may or may not be true rheumatoid arthritis but certainly has components of autoimmune phenomena which attacked the synovium.  I  recommended she continue on the methotrexate for at least a one year.  She has an apparent allergic reaction to the extended release agent in Depo-Medrol Some of these new allergic phenomenon is and rheumatoid titers are also worrisome for a paraneoplastic syndrome if must be vigilant to weight loss night sweats or any other phenomenon which may indicate further testing

## 2010-12-28 ENCOUNTER — Other Ambulatory Visit: Payer: Self-pay | Admitting: Internal Medicine

## 2011-01-03 ENCOUNTER — Telehealth: Payer: Self-pay | Admitting: *Deleted

## 2011-01-03 NOTE — Telephone Encounter (Signed)
please advise.

## 2011-01-03 NOTE — Telephone Encounter (Signed)
Allergic reaction to depo medrol inj she had in her hip a couple of months ago.  Red, hot swollen and itching mass at injection site.  It is worse since this weekend has used cortisone and neosporin.  Needs advice on what to do.

## 2011-01-04 ENCOUNTER — Encounter: Payer: Self-pay | Admitting: Family Medicine

## 2011-01-04 ENCOUNTER — Ambulatory Visit (INDEPENDENT_AMBULATORY_CARE_PROVIDER_SITE_OTHER): Payer: Medicare Other | Admitting: Family Medicine

## 2011-01-04 VITALS — BP 122/70 | Temp 98.6°F | Wt 134.0 lb

## 2011-01-04 DIAGNOSIS — T7840XA Allergy, unspecified, initial encounter: Secondary | ICD-10-CM

## 2011-01-04 DIAGNOSIS — Z888 Allergy status to other drugs, medicaments and biological substances status: Secondary | ICD-10-CM

## 2011-01-04 MED ORDER — HALOBETASOL PROPIONATE 0.05 % EX CREA: TOPICAL_CREAM | Freq: Two times a day (BID) | CUTANEOUS | Status: DC

## 2011-01-04 NOTE — Telephone Encounter (Signed)
Per dr Lovell Sheehan- this can not be from injection 2 months ago- probably needs ov to have it checked called pt and told husband to have her call us back -she was not at home h

## 2011-01-04 NOTE — Progress Notes (Signed)
  Subjective:    Patient ID: [name] [name], female    DOB: Feb 05, 1934, 77 y.o.   MRN: [id number]  HPI Here for 4 weeks of an area on the left buttock that is swollen, that itches, and that is painful. On 12-02-10 she received an injection of DepoMedrol at this site while here for a sinusitis. The area reacted within a few days, and it has persisted ever since. No SOB or systemic reactions. She had a similar reaction last December when she had a steroid shot for tennis elbow. That day she received Xylocaine and DepoMedrol, and was felt that she may have reacted to the Xylocaine. Lately she has tried ice packs on the buttock and is applying triamcinolone cream with mixed results. No fevers. No drainage from the site.    Review of Systems  Constitutional: Negative.   Respiratory: Negative.   Cardiovascular: Negative.   Skin: Positive for color change and rash.       Objective:   Physical Exam  Constitutional: She appears well-developed and well-nourished.  Skin:       The left upper buttock has an area about 4 cm in diameter which is red, swollen, warm, and slightly tender. It is not fluctuant, there is no abscess that is palpable. No streaking around the site.           Assessment & Plan:  This appears to be an intense local reaction to the DepoMedrol shot, probably to the slow release delivery agent it contained. Use ice packs. Switch to Halobetasol cream for better relief. This should resolve over the next few weeks. Recheck prn

## 2011-01-04 NOTE — Telephone Encounter (Signed)
Pt declined to see another physician due to the fact she saw two other physicians previously.  Pt states she did not want to get the depo injection because of the reactions she has from them however the other  physicians insisted to give it to her.  Pt requesting to be worked in with Dr. Lovell Sheehan.

## 2011-01-04 NOTE — Telephone Encounter (Signed)
Patient called back and decided to see Dr Clent Ridges today @ 4 pm.

## 2011-03-22 ENCOUNTER — Other Ambulatory Visit: Payer: Self-pay | Admitting: *Deleted

## 2011-03-22 MED ORDER — LORAZEPAM 1 MG PO TABS: 1.0000 mg | ORAL_TABLET | Freq: Three times a day (TID) | ORAL | Status: DC

## 2011-03-27 ENCOUNTER — Telehealth: Payer: Self-pay | Admitting: *Deleted

## 2011-03-27 ENCOUNTER — Other Ambulatory Visit: Payer: Self-pay | Admitting: Internal Medicine

## 2011-03-27 MED ORDER — LORAZEPAM 1 MG PO TABS: 1.0000 mg | ORAL_TABLET | Freq: Three times a day (TID) | ORAL | Status: DC

## 2011-03-27 NOTE — Telephone Encounter (Signed)
Pt is calling for a refill of Lorazepam.  Pharmacy states the request has been faxed x 3 times??

## 2011-03-27 NOTE — Telephone Encounter (Signed)
Per pharm lorazepam can not be e-scribe must fax hard copy or call in to [facility] main  [phone]

## 2011-03-27 NOTE — Telephone Encounter (Signed)
Had been faxed and called in twice- called in again and pt notified

## 2011-03-31 ENCOUNTER — Telehealth: Payer: Self-pay | Admitting: Internal Medicine

## 2011-03-31 MED ORDER — LEVOTHYROXINE SODIUM 125 MCG PO TABS: 125.0000 ug | ORAL_TABLET | Freq: Every day | ORAL | Status: DC

## 2011-03-31 NOTE — Telephone Encounter (Signed)
Pt would like generic synthroid 125 mcg.

## 2011-05-02 ENCOUNTER — Ambulatory Visit (INDEPENDENT_AMBULATORY_CARE_PROVIDER_SITE_OTHER): Payer: Medicare Other

## 2011-05-02 DIAGNOSIS — Z23 Encounter for immunization: Secondary | ICD-10-CM

## 2011-05-19 ENCOUNTER — Ambulatory Visit (INDEPENDENT_AMBULATORY_CARE_PROVIDER_SITE_OTHER): Payer: Medicare Other | Admitting: Internal Medicine

## 2011-05-19 ENCOUNTER — Encounter: Payer: Self-pay | Admitting: Internal Medicine

## 2011-05-19 VITALS — BP 112/72 | HR 68 | Temp 98.3°F | Resp 16 | Ht 62.0 in | Wt 135.0 lb

## 2011-05-19 DIAGNOSIS — E039 Hypothyroidism, unspecified: Secondary | ICD-10-CM

## 2011-05-19 DIAGNOSIS — T887XXA Unspecified adverse effect of drug or medicament, initial encounter: Secondary | ICD-10-CM

## 2011-05-19 DIAGNOSIS — M069 Rheumatoid arthritis, unspecified: Secondary | ICD-10-CM

## 2011-05-19 LAB — CBC WITH DIFFERENTIAL/PLATELET
Eosinophils Relative: 1.5 % (ref 0.0–5.0)
Monocytes Relative: 7.3 % (ref 3.0–12.0)
Neutrophils Relative %: 54.9 % (ref 43.0–77.0)
Platelets: 475 10*3/uL — ABNORMAL HIGH (ref 150.0–400.0)
WBC: 7.9 10*3/uL (ref 4.5–10.5)

## 2011-05-19 LAB — T4, FREE: Free T4: 1.42 ng/dL (ref 0.60–1.60)

## 2011-05-19 LAB — TSH: TSH: 0.48 u[IU]/mL (ref 0.35–5.50)

## 2011-05-19 NOTE — Progress Notes (Signed)
  Subjective:    Patient ID: [name] [name], female    DOB: [birth year], 77 y.o.   MRN: [id number]  HPI Pt is on MTX 1 mg for RA Has increased radicular back symptoms Hx of vitiligo Hx of splenectomy Generally she has been doing well with mild to moderate reflux symptoms. She is followed by gastroenterology Her primary concern is the use of methotrexate   Review of Systems  Constitutional: Negative for activity change, appetite change and fatigue.  HENT: Negative for ear pain, congestion, neck pain, postnasal drip and sinus pressure.   Eyes: Negative for redness and visual disturbance.  Respiratory: Negative for cough, shortness of breath and wheezing.   Gastrointestinal: Negative for abdominal pain and abdominal distention.  Genitourinary: Negative for dysuria, frequency and menstrual problem.  Musculoskeletal: Negative for myalgias, joint swelling and arthralgias.  Skin: Negative for rash and wound.  Neurological: Negative for dizziness, weakness and headaches.  Hematological: Negative for adenopathy. Does not bruise/bleed easily.  Psychiatric/Behavioral: Negative for sleep disturbance and decreased concentration.       Objective:   Physical Exam  Nursing note and vitals reviewed. Constitutional: She is oriented to person, place, and time. She appears well-developed and well-nourished. No distress.  HENT:  Head: Normocephalic and atraumatic.  Right Ear: External ear normal.  Left Ear: External ear normal.  Nose: Nose normal.  Mouth/Throat: Oropharynx is clear and moist.  Eyes: Conjunctivae and EOM are normal. Pupils are equal, round, and reactive to light.  Neck: Normal range of motion. Neck supple. No JVD present. No tracheal deviation present. No thyromegaly present.  Cardiovascular: Normal rate, regular rhythm, normal heart sounds and intact distal pulses.   No murmur heard. Pulmonary/Chest: Effort normal and breath sounds normal. She has no wheezes. She exhibits no  tenderness.  Abdominal: Soft. Bowel sounds are normal.  Musculoskeletal: Normal range of motion. She exhibits no edema and no tenderness.  Lymphadenopathy:    She has no cervical adenopathy.  Neurological: She is alert and oriented to person, place, and time. She has normal reflexes. No cranial nerve deficit.  Skin: Skin is warm and dry. She is not diaphoretic.  Psychiatric: She has a normal mood and affect. Her behavior is normal.          Assessment & Plan:   Counseled the patient about the use of methotrexate but to look for in terms of potential side effects cautioned her if she is any signs of infections to contact our office immediately discussed the use of additional folic acid.  I have spent more than 30 minutes examining this patient face-to-face of which over half was spent in counseling Monitor the patient for her thyroid replacement with a TSH T3 and T4 we refilled her medications

## 2011-05-19 NOTE — Patient Instructions (Signed)
The patient is instructed to continue all medications as prescribed. Schedule followup with check out clerk upon leaving the clinic  

## 2011-06-09 ENCOUNTER — Ambulatory Visit: Payer: Medicare Other | Admitting: Internal Medicine

## 2011-06-12 ENCOUNTER — Other Ambulatory Visit: Payer: Self-pay

## 2011-06-12 MED ORDER — PREDNISOLONE SODIUM PHOSPHATE 1 % OP SOLN: 1.0000 [drp] | OPHTHALMIC | Status: AC

## 2011-06-12 NOTE — Telephone Encounter (Signed)
DONE

## 2011-06-12 NOTE — Telephone Encounter (Signed)
Pt would like refill on prednisoLONE sodium phosphate (INFLAMASE FORTE) 1 % ophthalmic solution. [facility] [place]. Pls advise.

## 2011-06-21 ENCOUNTER — Other Ambulatory Visit: Payer: Self-pay | Admitting: Internal Medicine

## 2011-09-20 ENCOUNTER — Other Ambulatory Visit: Payer: Self-pay | Admitting: *Deleted

## 2011-09-20 MED ORDER — LORAZEPAM 1 MG PO TABS: 1.0000 mg | ORAL_TABLET | Freq: Three times a day (TID) | ORAL | Status: DC

## 2011-12-20 ENCOUNTER — Other Ambulatory Visit: Payer: Self-pay | Admitting: Internal Medicine

## 2012-01-18 ENCOUNTER — Other Ambulatory Visit: Payer: Self-pay | Admitting: *Deleted

## 2012-01-18 MED ORDER — CYANOCOBALAMIN 1000 MCG/ML IJ SOLN: 500.0000 ug | INTRAMUSCULAR | Status: DC

## 2012-03-27 ENCOUNTER — Other Ambulatory Visit: Payer: Self-pay | Admitting: *Deleted

## 2012-04-01 ENCOUNTER — Other Ambulatory Visit: Payer: Self-pay | Admitting: *Deleted

## 2012-04-01 MED ORDER — LORAZEPAM 1 MG PO TABS: 1.0000 mg | ORAL_TABLET | Freq: Three times a day (TID) | ORAL | Status: DC

## 2012-04-22 ENCOUNTER — Ambulatory Visit (INDEPENDENT_AMBULATORY_CARE_PROVIDER_SITE_OTHER): Payer: Medicare Other | Admitting: Internal Medicine

## 2012-04-22 DIAGNOSIS — Z23 Encounter for immunization: Secondary | ICD-10-CM

## 2012-06-05 ENCOUNTER — Other Ambulatory Visit: Payer: Self-pay | Admitting: Internal Medicine

## 2012-06-24 ENCOUNTER — Other Ambulatory Visit: Payer: Self-pay | Admitting: Internal Medicine

## 2012-07-09 ENCOUNTER — Ambulatory Visit: Payer: Medicare Other | Admitting: Internal Medicine

## 2012-07-22 DIAGNOSIS — Z23 Encounter for immunization: Secondary | ICD-10-CM

## 2012-08-20 ENCOUNTER — Other Ambulatory Visit: Payer: Self-pay | Admitting: Internal Medicine

## 2012-10-18 ENCOUNTER — Other Ambulatory Visit: Payer: Self-pay | Admitting: Internal Medicine

## 2012-11-06 ENCOUNTER — Other Ambulatory Visit: Payer: Self-pay | Admitting: Internal Medicine

## 2012-11-27 ENCOUNTER — Other Ambulatory Visit: Payer: Self-pay | Admitting: Internal Medicine

## 2013-03-11 ENCOUNTER — Telehealth: Payer: Self-pay | Admitting: Internal Medicine

## 2013-03-11 MED ORDER — TRIAMCINOLONE ACETONIDE 0.1 % EX CREA: TOPICAL_CREAM | Freq: Two times a day (BID) | CUTANEOUS | Status: AC

## 2013-03-11 NOTE — Telephone Encounter (Signed)
Could you answer for Dr Jenkins? 

## 2013-03-11 NOTE — Telephone Encounter (Signed)
rx sent in electronically, pt aware 

## 2013-03-11 NOTE — Telephone Encounter (Signed)
Patient Information:  Caller Name: [name]  Phone: [phone]  Patient: [name] [name]  Gender: Female  DOB: 09/02/1933  Age: 79 Years  PCP: Darryll Capers (Adults only)  Office Follow Up:  Does the office need to follow up with this patient?: Yes  Instructions For The Office: Please consider Rx request; call back to advise if Rx approved and/or if must be seen in office.  RN Note:  Severe itching interfering with caring for husband with Alzheimer's. Diagnosed and treated for extensive, itchy, poison ivy rash at [facility] 03/08/13.  Due to allergy to Prednisone, treated with IM Kenalog.  Unable to come into office to be seen due to primary caregiver for husband. Willing to be seen in next 2-3 days, if required. Requesting RX be ordered now; wants Halobetasol cream ordered 01/04/11 for allergic rash at injection site.  [facility] [phone]. Informed Dr Lovell Sheehan is out of the office all week; Rx request will be considered by MD on call.  Information noted and sent to office staff for call back ASAP.  Symptoms  Reason For Call & Symptoms: Severe itching from "poison ivy" on upper chest, arms, wrist, face and scalp after pulling weeds in yard.  Seen at [facility] 03/08/13. Unable to take oral steroids so treated with Kenalog IM.  Reviewed Health History In EMR: Yes  Reviewed Medications In EMR: Yes  Reviewed Allergies In EMR: Yes  Reviewed Surgeries / Procedures: Yes  Date of Onset of Symptoms: 03/04/2013  Treatments Tried: Calamine lotion, Benadryl po  Treatments Tried Worked: Yes  Guideline(s) Used:  Poison Ivy - [facility] or [facility]  Disposition Per Guideline:   See Today in Office  Reason For Disposition Reached:   Severe itching interferes with normal activities (e.g., work or school) or prevents sleep  Advice Given: Cold Compresses Topical Steroid TID  RN Overrode Recommendation:  Patient Requests Prescription  Willing to be seen at later date; [employer] diagnosed and treated in UC.

## 2013-03-11 NOTE — Telephone Encounter (Signed)
Kenalog ok  60 gms

## 2013-03-11 NOTE — Telephone Encounter (Signed)
Pt has poison ivy and went to urgent care over the weekend. Pt did not ask for steroid ointment, but is having a lot of itching and would like a rx for the severe  Itching.  Pt had kenalog injection.   triamcinolone (KENALOG) 0.5 % cream  Or halobetasol (ULTRAVATE) 0.05 % cream whichever is better. [facility] [place] [phone]  (Pt cannot leave husband w/ alzheimer's).

## 2013-04-06 ENCOUNTER — Other Ambulatory Visit: Payer: Self-pay | Admitting: Internal Medicine

## 2013-05-06 ENCOUNTER — Ambulatory Visit (INDEPENDENT_AMBULATORY_CARE_PROVIDER_SITE_OTHER): Payer: Medicare Other | Admitting: Family

## 2013-05-06 ENCOUNTER — Ambulatory Visit: Payer: Medicare Other | Admitting: Family

## 2013-05-06 ENCOUNTER — Encounter: Payer: Self-pay | Admitting: Family

## 2013-05-06 VITALS — BP 118/80 | HR 98 | Wt 121.0 lb

## 2013-05-06 DIAGNOSIS — M940 Chondrocostal junction syndrome [Tietze]: Secondary | ICD-10-CM

## 2013-05-06 DIAGNOSIS — R0789 Other chest pain: Secondary | ICD-10-CM

## 2013-05-06 DIAGNOSIS — R071 Chest pain on breathing: Secondary | ICD-10-CM

## 2013-05-06 MED ORDER — METHYLPREDNISOLONE ACETATE 40 MG/ML IJ SUSP
40.0000 mg | Freq: Once | INTRAMUSCULAR | Status: AC
  Administered 2013-05-06: 40 mg via INTRAMUSCULAR

## 2013-05-06 MED ORDER — KETOROLAC TROMETHAMINE 60 MG/2ML IM SOLN
60.0000 mg | Freq: Once | INTRAMUSCULAR | Status: AC
  Administered 2013-05-06: 60 mg via INTRAMUSCULAR

## 2013-05-06 NOTE — Patient Instructions (Signed)
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
## 2013-05-08 ENCOUNTER — Telehealth: Payer: Self-pay | Admitting: Internal Medicine

## 2013-05-08 MED ORDER — LIDOCAINE 5 % EX PTCH: 1.0000 | MEDICATED_PATCH | CUTANEOUS | Status: AC

## 2013-05-08 NOTE — Telephone Encounter (Signed)
Pt states she is not much better and would like to know if you think the other dose of prednisone might help? Pt lives in [place] and would like to not have to come back in unless you think she should. Pt would like you to call her. Cell [phone]

## 2013-05-08 NOTE — Telephone Encounter (Signed)
Pt would like lidocaine patches. The ones she has are out of date, She does want them now!! [facility] [place]

## 2013-05-08 NOTE — Telephone Encounter (Signed)
An oral steroid is ideal. She was not willing to try if before. We cannot inject another dose. Continue Ice and heat. Consider Lidoderm patch as we discussed.

## 2013-05-08 NOTE — Telephone Encounter (Signed)
Pt would like to try the oral steroid. Please advise

## 2013-05-08 NOTE — Telephone Encounter (Signed)
Please advise 

## 2013-05-08 NOTE — Telephone Encounter (Signed)
Pt call be reached on her cell

## 2013-05-09 ENCOUNTER — Other Ambulatory Visit: Payer: Self-pay | Admitting: Internal Medicine

## 2013-05-26 ENCOUNTER — Other Ambulatory Visit: Payer: Self-pay | Admitting: Internal Medicine

## 2013-07-23 ENCOUNTER — Telehealth: Payer: Self-pay | Admitting: Internal Medicine

## 2013-07-23 NOTE — Telephone Encounter (Signed)
Pt request new rx for LORazepam (ATIVAN) 1 MG tablet Pt received letter from medicare stating her rx needs to be written 1 tab 3 X /day. instead the 100 as previously written. This rx not due until 08/10/13. Pt moved so [facility] pharm is [facility] [address]  [phone]

## 2013-07-23 NOTE — Telephone Encounter (Signed)
Called to pharmacy to be filled on 2-1

## 2013-08-12 ENCOUNTER — Other Ambulatory Visit: Payer: Self-pay | Admitting: Internal Medicine

## 2013-08-29 ENCOUNTER — Telehealth: Payer: Self-pay | Admitting: Internal Medicine

## 2013-08-29 ENCOUNTER — Other Ambulatory Visit: Payer: Self-pay | Admitting: *Deleted

## 2013-08-29 MED ORDER — TEMAZEPAM 7.5 MG PO CAPS: 7.5000 mg | ORAL_CAPSULE | Freq: Every evening | ORAL | Status: AC | PRN

## 2013-08-29 NOTE — Telephone Encounter (Signed)
Per dr Lovell Sheehanjenkins- may have temazepam 7.5 pt informed and called to pharmacy

## 2013-08-29 NOTE — Telephone Encounter (Signed)
Pt is having trouble sleeping pt has tried benadryl,lorazepam and melatonin otc. [facility] [place] [phone]. [name] has alz disease. Pt would like rx call into pharm

## 2013-09-18 ENCOUNTER — Ambulatory Visit: Payer: Medicare Other | Admitting: Family

## 2013-09-19 ENCOUNTER — Encounter: Payer: Self-pay | Admitting: *Deleted

## 2013-09-22 ENCOUNTER — Ambulatory Visit: Payer: Medicare Other | Admitting: Family

## 2013-09-22 ENCOUNTER — Encounter: Payer: Self-pay | Admitting: Family

## 2013-09-22 ENCOUNTER — Ambulatory Visit (INDEPENDENT_AMBULATORY_CARE_PROVIDER_SITE_OTHER): Payer: Medicare Other | Admitting: Family

## 2013-09-22 VITALS — BP 130/68 | HR 84 | Wt 123.4 lb

## 2013-09-22 DIAGNOSIS — H02829 Cysts of unspecified eye, unspecified eyelid: Secondary | ICD-10-CM

## 2013-09-22 DIAGNOSIS — I7789 Other specified disorders of arteries and arterioles: Secondary | ICD-10-CM

## 2013-09-22 DIAGNOSIS — E039 Hypothyroidism, unspecified: Secondary | ICD-10-CM

## 2013-09-22 DIAGNOSIS — F411 Generalized anxiety disorder: Secondary | ICD-10-CM

## 2013-09-22 DIAGNOSIS — D510 Vitamin B12 deficiency anemia due to intrinsic factor deficiency: Secondary | ICD-10-CM

## 2013-09-22 DIAGNOSIS — M0520 Rheumatoid vasculitis with rheumatoid arthritis of unspecified site: Secondary | ICD-10-CM

## 2013-09-22 LAB — TSH: TSH: 0.38 u[IU]/mL (ref 0.35–5.50)

## 2013-09-22 LAB — FERRITIN: FERRITIN: 25.6 ng/mL (ref 10.0–291.0)

## 2013-09-22 MED ORDER — LEVOTHYROXINE SODIUM 125 MCG PO TABS: 125.0000 ug | ORAL_TABLET | Freq: Every day | ORAL | Status: DC

## 2013-09-22 MED ORDER — LORAZEPAM 1 MG PO TABS: ORAL_TABLET | ORAL | Status: DC

## 2013-09-22 NOTE — Patient Instructions (Signed)

## 2013-09-22 NOTE — Progress Notes (Signed)
Subjective:    Patient ID: [name] [name], female    DOB: August 04, 1933, 80 y.o.   MRN: [id number]  HPI 80 y.o. White female presents today to follow up with medication for her anxiety and hypothyroid. Pt states that "it has been a few years since I have been here and I need to get my thyroid checked and refills on my medicine". 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 Pt also has a chief complaint of "I have this thing in my eye that gets in my vision". Pt says she has had the growth for 6-7 months and it has continued to get larger, it is non-painful but it does aggravate her as it interferes with her vision. Pt also acknowledges fatigue. Denies SOB, fever, malaise and change in appetite.     Review of Systems  Constitutional: Positive for fatigue.  HENT: Negative.   Eyes: Positive for visual disturbance.       Growth on left eye lid that is now interfering with her vision.   Respiratory: Negative.   Cardiovascular: Negative.   Gastrointestinal: Negative.   Endocrine: Negative.   Genitourinary: Negative.   Musculoskeletal: Negative.   Skin: Negative.   Allergic/Immunologic: Negative.   Neurological: Positive for weakness.  Hematological: Negative.   Psychiatric/Behavioral: Negative.    Past Medical History  Diagnosis Date  . History of ITP     autoimmune  . Thyroid disease   . Achlorhydria   . Autoimmune gastritis   . Vitiligo   . Subdural hematoma   . Rheumatoid arthritis(714.0)     History   Social History  . Marital Status: Married    Spouse Name: N/A    Number of Children: N/A  . Years of Education: N/A   Occupational History  . Not on file.   Social History Main Topics  . Smoking status: Never Smoker   . Smokeless tobacco: Not on file  . Alcohol Use: No  . Drug Use: No  . Sexual Activity: Not Currently   Other Topics Concern  . Not on file    Social History Narrative  . No narrative on file    Past Surgical History  Procedure Laterality Date  . Appendectomy    . Splenectomy    . Cataract extraction      Family History  Problem Relation Age of Onset  . Multiple myeloma Mother   . Cancer Mother     multiple myleoma  . Heart disease Sister   . Parkinsonism Maternal Aunt     Allergies  Allergen Reactions  . Depo-Medrol [Methylprednisolone Acetate]     Patient has apparent allergy to the drug ingredient in Depo-Medrol that allows time to release he has a rash at the site of injections  . Levetiracetam   . Penicillins   . Phenytoin Sodium Extended   . Sulfa Antibiotics     Current Outpatient Prescriptions on File Prior to Visit  Medication Sig Dispense Refill  . cyanocobalamin (,VITAMIN B-12,) 1000 MCG/ML injection INJECT 0.5 ML INTO THE MUSCLE EVERY 30 DAYS.  3 mL  11  . fluticasone (FLONASE) 50 MCG/ACT nasal spray USE 2 PUFFS IN EACH NOSTRIL DAILY  16 g  6  . folic acid (FOLVITE) 1 MG tablet Take 2 mg by mouth daily.       .Marland Kitchenlevothyroxine (SYNTHROID, LEVOTHROID) 125 MCG tablet TAKE 1 TABLET EVERY DAY  30  tablet  5  . lidocaine (LIDODERM) 5 % Place 1 patch onto the skin daily. Remove & Discard patch within 12 hours  30 patch  0  . methotrexate 1 G injection Inject 25 mg into the skin once. 1cc subq--100 mg/4cc      . prednisoLONE sodium phosphate (INFLAMASE FORTE) 1 % ophthalmic solution Apply 1 drop to eye as directed.  15 mL  3  . temazepam (RESTORIL) 7.5 MG capsule Take 1 capsule (7.5 mg total) by mouth at bedtime as needed for sleep.  30 capsule  2  . triamcinolone cream (KENALOG) 0.1 % Apply topically 2 (two) times daily.  60 g  0   No current facility-administered medications on file prior to visit.    BP 130/68  Pulse 84  Wt 123 lb 6.4 oz (55.974 kg)  SpO2 96%chart    Objective:   Physical Exam  Constitutional: She is oriented to person, place, and time. She appears well-developed and  well-nourished. She is active.  HENT:  Head: Normocephalic.  Eyes: Conjunctivae and EOM are normal. Pupils are equal, round, and reactive to light.  Cyst to left eye lid.   Cardiovascular: Normal rate, regular rhythm, S1 normal, S2 normal, normal heart sounds and normal pulses.   Pulmonary/Chest: Effort normal and breath sounds normal.  Abdominal: Soft. Normal appearance and bowel sounds are normal.  Neurological: She is alert and oriented to person, place, and time.  Skin: Skin is warm, dry and intact.  Psychiatric: Her speech is normal and behavior is normal. Thought content normal. She exhibits a depressed mood.          Assessment & Plan:  80 y.o. White female presents today for follow up to have medications for hypothyroid and anxiety refilled.  - Hypothyroid: continue taking Synthroid 78mg  - Draw TSH level - Anxiety: Will refill Lorazepam, pt advised to continue to find help dealing with her husbands Alzheimer and change to lifestyle.  - Anemia- Will draw ferritin level.  - [facility]   - Education: Advised patient to take care of herself while dealing with her new stressors at home and discussed care giver role strain.   - Follow up: as needed, will call with lab results.

## 2013-11-10 ENCOUNTER — Other Ambulatory Visit: Payer: Self-pay | Admitting: Internal Medicine

## 2013-12-05 ENCOUNTER — Ambulatory Visit: Payer: Medicare Other | Admitting: Family

## 2013-12-15 ENCOUNTER — Other Ambulatory Visit: Payer: Self-pay | Admitting: Internal Medicine

## 2014-05-25 ENCOUNTER — Other Ambulatory Visit: Payer: Self-pay | Admitting: Certified Registered Nurse Anesthetist

## 2014-05-25 DIAGNOSIS — IMO0002 Reserved for concepts with insufficient information to code with codable children: Secondary | ICD-10-CM

## 2014-05-25 DIAGNOSIS — M1710 Unilateral primary osteoarthritis, unspecified knee: Secondary | ICD-10-CM

## 2014-06-01 ENCOUNTER — Telehealth: Payer: Self-pay

## 2014-06-01 NOTE — Telephone Encounter (Signed)
[place] [place]   Pt has changed to a new physician outside of [place].

## 2022-07-10 DEATH — deceased
